# Patient Record
Sex: Male | Born: 1965 | Race: White | Hispanic: No | Marital: Married | Smoking: Former smoker
Health system: Southern US, Community
[De-identification: ages and names within clinical notes are randomized; demographics above are authoritative.]

## PROBLEM LIST (undated history)

## (undated) HISTORY — PX: APPENDECTOMY: SHX54

## (undated) HISTORY — PX: HIP SURGERY: SHX245

## (undated) HISTORY — PX: HERNIA REPAIR: SHX51

---

## 2002-08-06 ENCOUNTER — Emergency Department (HOSPITAL_COMMUNITY): Admission: EM | Admit: 2002-08-06 | Discharge: 2002-08-07 | Payer: Self-pay | Admitting: Emergency Medicine

## 2002-08-15 ENCOUNTER — Emergency Department (HOSPITAL_COMMUNITY): Admission: EM | Admit: 2002-08-15 | Discharge: 2002-08-15 | Payer: Self-pay | Admitting: Emergency Medicine

## 2002-11-07 ENCOUNTER — Encounter: Admission: RE | Admit: 2002-11-07 | Discharge: 2003-02-05 | Payer: Self-pay | Admitting: Orthopaedic Surgery

## 2012-12-03 ENCOUNTER — Emergency Department (HOSPITAL_COMMUNITY): Payer: BC Managed Care – PPO

## 2012-12-03 ENCOUNTER — Emergency Department (HOSPITAL_COMMUNITY)
Admission: EM | Admit: 2012-12-03 | Discharge: 2012-12-04 | Disposition: A | Discharge status: Home / Self Care | Payer: BC Managed Care – PPO | Attending: Emergency Medicine | Admitting: Emergency Medicine

## 2012-12-03 ENCOUNTER — Encounter (HOSPITAL_COMMUNITY): Payer: Self-pay | Admitting: *Deleted

## 2012-12-03 DIAGNOSIS — N453 Epididymo-orchitis: Secondary | ICD-10-CM | POA: Insufficient documentation

## 2012-12-03 DIAGNOSIS — Z87891 Personal history of nicotine dependence: Secondary | ICD-10-CM | POA: Insufficient documentation

## 2012-12-03 DIAGNOSIS — N451 Epididymitis: Secondary | ICD-10-CM

## 2012-12-03 DIAGNOSIS — N5089 Other specified disorders of the male genital organs: Secondary | ICD-10-CM | POA: Insufficient documentation

## 2012-12-03 NOTE — ED Notes (Signed)
Pt states that he began to have lower abd pain and testicular pain; pt states that he was seen at Urgent Care and was told no UTI and needed to come to the ER for evaluation; pt states that there is swelling and tenderness to left testicle

## 2012-12-03 NOTE — ED Notes (Signed)
[id number]<AV> Expected date:<BR> Expected time:<BR> Means of arrival:<BR> Comments:<BR>

## 2012-12-04 MED ORDER — IBUPROFEN 600 MG PO TABS: 600.0000 mg | ORAL_TABLET | Freq: Four times a day (QID) | ORAL | Status: DC | PRN

## 2012-12-04 MED ORDER — CIPROFLOXACIN HCL 500 MG PO TABS
500.0000 mg | ORAL_TABLET | Freq: Once | ORAL | Status: AC
  Administered 2012-12-04: 500 mg via ORAL
  Filled 2012-12-04 (×2): qty 1

## 2012-12-04 MED ORDER — HYDROCODONE-ACETAMINOPHEN 5-325 MG PO TABS: 1.0000 | ORAL_TABLET | Freq: Four times a day (QID) | ORAL | Status: DC | PRN

## 2012-12-04 MED ORDER — CIPROFLOXACIN HCL 500 MG PO TABS: 500.0000 mg | ORAL_TABLET | Freq: Two times a day (BID) | ORAL | Status: DC

## 2012-12-04 MED ORDER — CEFTRIAXONE SODIUM 250 MG IJ SOLR
250.0000 mg | Freq: Once | INTRAMUSCULAR | Status: AC
  Administered 2012-12-04: 250 mg via INTRAMUSCULAR
  Filled 2012-12-04: qty 250

## 2012-12-04 NOTE — ED Provider Notes (Signed)
History     CSN: [id number]  Arrival date & time 12/03/12  2019   First MD Initiated Contact with Patient 12/03/12 2358      Chief Complaint  Patient presents with  . Testicle Pain    (Consider location/radiation/quality/duration/timing/severity/associated sxs/prior treatment) HPI Tyler Garrison is a 47 y.o. male who presents to ED with complaint of testicular pain. Stats pain began during lunch time today. Pain in the left side only, states noted swelling. Went to UC. Had UA done there which was normal. Was sent here. Pt denies taking any medications for it. Denies fever, chills. Urinary symptoms. No testicular trauma. No hx of the same.    History reviewed. No pertinent past medical history.  Past Surgical History  Procedure Laterality Date  . Hip surgery    . Hernia repair      No family history on file.  History  Substance Use Topics  . Smoking status: Former Games developer  . Smokeless tobacco: Not on file  . Alcohol Use: Yes     Comment: with dinner      Review of Systems  Constitutional: Negative for fever and chills.  Respiratory: Negative.   Cardiovascular: Negative.   Genitourinary: Positive for scrotal swelling and testicular pain. Negative for dysuria, urgency, frequency, flank pain, discharge and penile pain.  Skin: Negative.   All other systems reviewed and are negative.    Allergies  Review of patient's allergies indicates no known allergies.  Home Medications  No current outpatient prescriptions on file.  BP 138/88  Pulse 83  Temp(Src) 98.3 F (36.8 C) (Oral)  Resp 20  Wt 202 lb 4 oz (91.74 kg)  SpO2 97%  Physical Exam  Nursing note and vitals reviewed. Constitutional: He is oriented to person, place, and time. He appears well-developed and well-nourished. No distress.  Eyes: Conjunctivae are normal.  Neck: Neck supple.  Cardiovascular: Normal rate, regular rhythm and normal heart sounds.   Pulmonary/Chest: Effort normal and breath sounds  normal. No respiratory distress. He has no wheezes. He has no rales.  Genitourinary: Penis normal.  Left testicular swelling. Tender to palpation. No hernia  Neurological: He is alert and oriented to person, place, and time.  Skin: Skin is warm and dry.    ED Course  Procedures (including critical care time)  Labs Reviewed - No data to display US Scrotum  12/03/2012  *RADIOLOGY REPORT*  Clinical Data:  Left testicle pain.  SCROTAL ULTRASOUND DOPPLER ULTRASOUND OF THE TESTICLES  Technique: Complete ultrasound examination of the testicles, epididymis, and other scrotal structures was performed.  Color and spectral Doppler ultrasound were also utilized to evaluate blood flow to the testicles.  Comparison:  Findings:  Right testis:  Normal.  4.3 x 2.2 x 3.0 cm.  Left testis:  Normal.  4.2 x 2.4 x 2.8 cm.  Right epididymis:  3 mm epididymal cyst.  Otherwise normal.  Left epididymis:  The left epididymis is enlarged with hypervascularity consistent with epididymitis.  Hydrocele:  Tiny left hydrocele.  Varicocele:  Prominent vascularity in the region of the left inguinal canal suggesting a varicocele.  Pulsed Doppler interrogation of both testes demonstrates low resistance flow bilaterally.  IMPRESSION:  1.  No evidence of testicular torsion. 2.  Left epididymitis. 3.  Probable left varicocele.   Original Report Authenticated By: Francene Boyers, M.D.    [place] Art/ven Flow Abd Pelv Doppler Limited  12/03/2012  *RADIOLOGY REPORT*  Clinical Data:  Left testicle pain.  SCROTAL ULTRASOUND DOPPLER ULTRASOUND OF  THE TESTICLES  Technique: Complete ultrasound examination of the testicles, epididymis, and other scrotal structures was performed.  Color and spectral Doppler ultrasound were also utilized to evaluate blood flow to the testicles.  Comparison:  Findings:  Right testis:  Normal.  4.3 x 2.2 x 3.0 cm.  Left testis:  Normal.  4.2 x 2.4 x 2.8 cm.  Right epididymis:  3 mm epididymal cyst.  Otherwise normal.  Left  epididymis:  The left epididymis is enlarged with hypervascularity consistent with epididymitis.  Hydrocele:  Tiny left hydrocele.  Varicocele:  Prominent vascularity in the region of the left inguinal canal suggesting a varicocele.  Pulsed Doppler interrogation of both testes demonstrates low resistance flow bilaterally.  IMPRESSION:  1.  No evidence of testicular torsion. 2.  Left epididymitis. 3.  Probable left varicocele.   Original Report Authenticated By: Francene Boyers, M.D.      1. Epididymitis, left       MDM  Pt's UA completely normal at [facility], he has results with him. His US shows left epididymitis. Treated in ED with rocephin 250mg  IM. cipro 500mg  PO. Will start on cipro 500mg  bid for 10 days. Follow up with urology. Pt  Otherwise non toxic, no distress. Vs normal.   Filed Vitals:   12/03/12 2032  BP: 138/88  Pulse: 83  Temp: 98.3 F (36.8 C)  TempSrc: Oral  Resp: 20  Weight: 202 lb 4 oz (91.74 kg)  SpO2: 97%           Tyler Gram [name], PA-C 12/04/12 0023

## 2012-12-04 NOTE — ED Provider Notes (Signed)
Medical screening examination/treatment/procedure(s) were performed by non-physician practitioner and as supervising physician I was immediately available for consultation/collaboration.    Celene Kras, MD 12/04/12 0630

## 2013-10-12 ENCOUNTER — Ambulatory Visit (INDEPENDENT_AMBULATORY_CARE_PROVIDER_SITE_OTHER): Payer: BC Managed Care – PPO | Admitting: Emergency Medicine

## 2013-10-12 VITALS — BP 120/88 | HR 73 | Temp 98.1°F | Resp 16 | Ht 70.5 in | Wt 202.0 lb

## 2013-10-12 DIAGNOSIS — T1502XA Foreign body in cornea, left eye, initial encounter: Secondary | ICD-10-CM

## 2013-10-12 DIAGNOSIS — T1500XA Foreign body in cornea, unspecified eye, initial encounter: Secondary | ICD-10-CM

## 2013-10-12 DIAGNOSIS — M546 Pain in thoracic spine: Secondary | ICD-10-CM

## 2013-10-12 MED ORDER — HYDROCODONE-ACETAMINOPHEN 5-325 MG PO TABS: 1.0000 | ORAL_TABLET | Freq: Four times a day (QID) | ORAL | Status: DC | PRN

## 2013-10-12 MED ORDER — OFLOXACIN 0.3 % OP SOLN: 1.0000 [drp] | Freq: Four times a day (QID) | OPHTHALMIC | Status: DC

## 2013-10-12 NOTE — Patient Instructions (Signed)
Corneal Abrasion  The cornea is the clear covering at the front and center of the eye. When looking at the colored portion of the eye (iris), you are looking through the cornea. This very thin tissue is made up of many layers. The surface layer is a single layer of cells (corneal epithelium) and is one of the most sensitive tissues in the body. If a scratch or injury causes the corneal epithelium to come off, it is called a corneal abrasion. If the injury extends to the tissues below the epithelium, the condition is called a corneal ulcer.  CAUSES    Scratches.   Trauma.   Foreign body in the eye.  Some people have recurrences of abrasions in the area of the original injury even after it has healed (recurrent erosion syndrome). Recurrent erosion syndrome generally improves and goes away with time.  SYMPTOMS    Eye pain.   Difficulty or inability to keep the injured eye open.   The eye becomes very sensitive to light.   Recurrent erosions tend to happen suddenly, first thing in the morning, usually after waking up and opening the eye.  DIAGNOSIS   Your health care provider can diagnose a corneal abrasion during an eye exam. Dye is usually placed in the eye using a drop or a small paper strip moistened by your tears. When the eye is examined with a special light, the abrasion shows up clearly because of the dye.  TREATMENT    Small abrasions may be treated with antibiotic drops or ointment alone.   Usually a pressure patch is specially applied. Pressure patches prevent the eye from blinking, allowing the corneal epithelium to heal. A pressure patch also reduces the amount of pain present in the eye during healing. Most corneal abrasions heal within 2 3 days with no effect on vision.  If the abrasion becomes infected and spreads to the deeper tissues of the cornea, a corneal ulcer can result. This is serious because it can cause corneal scarring. Corneal scars interfere with light passing through the cornea  and cause a loss of vision in the involved eye.  HOME CARE INSTRUCTIONS   Use medicine or ointment as directed. Only take over-the-counter or prescription medicines for pain, discomfort, or fever as directed by your health care provider.   Do not drive or operate machinery while your eye is patched. Your ability to judge distances is impaired.   If your health care provider has given you a follow-up appointment, it is very important to keep that appointment. Not keeping the appointment could result in a severe eye infection or permanent loss of vision. If there is any problem keeping the appointment, let your health care provider know.  SEEK MEDICAL CARE IF:    You have pain, light sensitivity, and a scratchy feeling in one eye or both eyes.   Your pressure patch keeps loosening up, and you can blink your eye under the patch after treatment.   Any kind of discharge develops from the eye after treatment or if the lids stick together in the morning.   You have the same symptoms in the morning as you did with the original abrasion days, weeks, or months after the abrasion healed.  MAKE SURE YOU:    Understand these instructions.   Will watch your condition.   Will get help right away if you are not doing well or get worse.  Document Released: 08/05/2000 Document Revised: 05/29/2013 Document Reviewed: 04/15/2013  [employer] Patient Information   2014 [employer]

## 2013-10-12 NOTE — Progress Notes (Addendum)
Subjective:   Patient ID: Tyler MoatBrian K Furnas, male    DOB: 11/08/1965, 48 y.o.   MRN: [id number]  HPI  This chart was scribed for Tyler SpareSteven A. Cleta Albertsaub, MD, by Tyler Garrison, ED Scribe. This patient was seen in room 11 and the patient's care was started at 4:31 PM.  HPI Comments: Tyler Garrison is a 48 y.o. male who [facility] complaining of L eye pain with onset one day.Patient describes the pain as a constant, non changing irritation. He reports that upon examining his eye in the mirror, he has noticed some mild visual disturbances in the left eye, which he describes as blurry vision. Patient denies any FB entering his eyes. He states that he was spraying a water-based, clear lacquer on wood yesterday and is concerned he may have gotten some in his eye. Patient confirms having a pair of protective eye glasses that were slightly undersized. 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 He denies trying to wash his eyes with any products.  Patient also reports of constant R upper back pain with onset one day. Patient reports he was working and while moving objects he "twisted the wrong way." Patient describes the pain as a soreness that is constant and non changing. He denies any other pain or neck stiffness.  History reviewed. No pertinent past medical history.  Past Surgical History  Procedure Laterality Date  . Hip surgery    . Hernia repair      History reviewed. No pertinent family history.  History   Social History  . Marital Status: Married    Spouse Name: N/A    Number of Children: N/A  . Years of Education: N/A   Occupational History  . Not on file.   Social History Main Topics  . Smoking status: Former Games developermoker  . Smokeless tobacco: Not on file  . Alcohol Use: Yes     Comment: with dinner  . Drug Use: No  . Sexual Activity: Not on file   Other Topics Concern  . Not on file   Social History Narrative  . No narrative on file    No Known  Allergies  There are no active problems to display for this patient.   No results found for this or any previous visit.  Current Outpatient Prescriptions on File Prior to Visit  Medication Sig Dispense Refill  . ciprofloxacin (CIPRO) 500 MG tablet Take 1 tablet (500 mg total) by mouth 2 (two) times daily.  20 tablet  0  . HYDROcodone-acetaminophen (NORCO) 5-325 MG per tablet Take 1 tablet by mouth every 6 (six) hours as needed for pain.  20 tablet  0  . ibuprofen (ADVIL,MOTRIN) 600 MG tablet Take 1 tablet (600 mg total) by mouth every 6 (six) hours as needed for pain.  30 tablet  0   No current facility-administered medications on file prior to visit.    BP 120/88  Pulse 73  Temp(Src) 98.1 F (36.7 C) (Oral)  Resp 16  Ht 5' 10.5" (1.791 m)  Wt 202 lb (91.627 kg)  BMI 28.56 kg/m2  SpO2 99%   Review of Systems  Constitutional: Negative for fever and chills.  Eyes: Positive for pain and visual disturbance.  Respiratory: Negative for shortness of breath.   Gastrointestinal: Negative for nausea and vomiting.  Musculoskeletal: Positive for back pain.  Neurological: Negative for weakness.   A complete 10 system review of systems was obtained and all systems are negative except  as noted in the HPI and PMH.   Objective:   Physical Exam  Nursing note and vitals reviewed. CONSTITUTIONAL: Well developed/well nourished HEAD: Normocephalic/atraumatic EYES: There is a 1 mm area of uptake at 2:00 left adjacent to the corneal scleral junction. This was seen after 3 drops of Pontocaine were instilled along with using porcine stain. No foreign body was seen beneath the lid. A moist Q-tip was used and this area appeared to be removed as the uptake appeared much less prominent post use of a Q-tip for foreign body removal. Patient tolerated well ENMT: Mucous membranes moist NECK: supple no meningeal signs SPINE:entire spine nontender CV: S1/S2 noted, no murmurs/rubs/gallops noted LUNGS:  Lungs are clear to auscultation bilaterally, no apparent distress ABDOMEN: soft, nontender, no rebound or guarding GU:no cva tenderness NEURO: Pt is awake/alert, moves all extremitiesx4 EXTREMITIES: pulses normal, full ROM SKIN: warm, color normal PSYCH: no abnormalities of mood noted There is tenderness adjacent to the right scapula and just beneath the right scapula . Deep tendon reflexes of the upper extremity to 2+ and symmetrical Assessment & Plan:     he was given a prescription for Ocuflox 1 drop in the left eye every 4-6 hours. He will return to clinic in the morning for an eye check he was given #10 hydrocodone to take for severe back pain. I felt like I removed the foreign body with a Q-tip but I wanted to recheck that area in the morning to be sure I personally performed the services described in this documentation, which was scribed in my presence. The recorded information has been reviewed and is accurate. **Disclaimer: This note was dictated with voice recognition software. Similar sounding words can inadvertently be transcribed and this note may contain transcription errors which may not have been corrected upon publication of note.**

## 2013-10-13 ENCOUNTER — Ambulatory Visit (INDEPENDENT_AMBULATORY_CARE_PROVIDER_SITE_OTHER): Payer: BC Managed Care – PPO | Admitting: Emergency Medicine

## 2013-10-13 VITALS — BP 116/80 | HR 86 | Temp 98.3°F | Resp 12 | Ht 71.0 in | Wt 201.6 lb

## 2013-10-13 DIAGNOSIS — S0500XA Injury of conjunctiva and corneal abrasion without foreign body, unspecified eye, initial encounter: Secondary | ICD-10-CM

## 2013-10-13 DIAGNOSIS — S058X9A Other injuries of unspecified eye and orbit, initial encounter: Secondary | ICD-10-CM

## 2013-10-13 DIAGNOSIS — M25511 Pain in right shoulder: Secondary | ICD-10-CM

## 2013-10-13 DIAGNOSIS — M25519 Pain in unspecified shoulder: Secondary | ICD-10-CM

## 2013-10-13 NOTE — Progress Notes (Signed)
   Subjective:    Patient ID: Tyler Garrison, male    DOB: 01/03/1966, 48 y.o.   MRN: [id number]  HPI patient here for followup of his left corneal abrasion. A small foreign body was removed with acute hip he is here today for followup and recheck on an area of fluorescein uptake in the left.eye. He states he feels fine. He no longer has any pain or discomfort or foreign body sensation. The pain in the right scapular area is also better.    Review of Systems     Objective:   Physical Exam Pupils are equal and reactive to light disc margins sharp cornea appears clear 3 drops of Pontocaine were instilled along with forcing staining. There was no uptake in the area of abnormality seen yesterday.       Assessment & Plan:  This corneal abrasion has resolved. He is to finish his antibiotics today and after that he can stop

## 2013-10-16 ENCOUNTER — Telehealth: Payer: Self-pay

## 2013-10-16 NOTE — Telephone Encounter (Signed)
Pt requesting refill for hydrocodone   Best phone [phone]

## 2013-10-17 MED ORDER — HYDROCODONE-ACETAMINOPHEN 5-325 MG PO TABS: 1.0000 | ORAL_TABLET | Freq: Four times a day (QID) | ORAL | Status: DC | PRN

## 2013-10-17 NOTE — Telephone Encounter (Signed)
Notified pt ready and needs to RTC if pain persists. Pt agreed.

## 2013-10-17 NOTE — Telephone Encounter (Signed)
Okay to refill  #12 tablets. If patient is continuing to have back pain he needs reevaluation.

## 2015-07-02 IMAGING — CR DG ELBOW COMPLETE 3+V*R*
3 series · 3 of 3 positions shown · non-contrast
Comparison: None.

CLINICAL DATA: Right elbow pain after injury.

EXAM:
RIGHT ELBOW - COMPLETE 3+ VIEW

[AP]
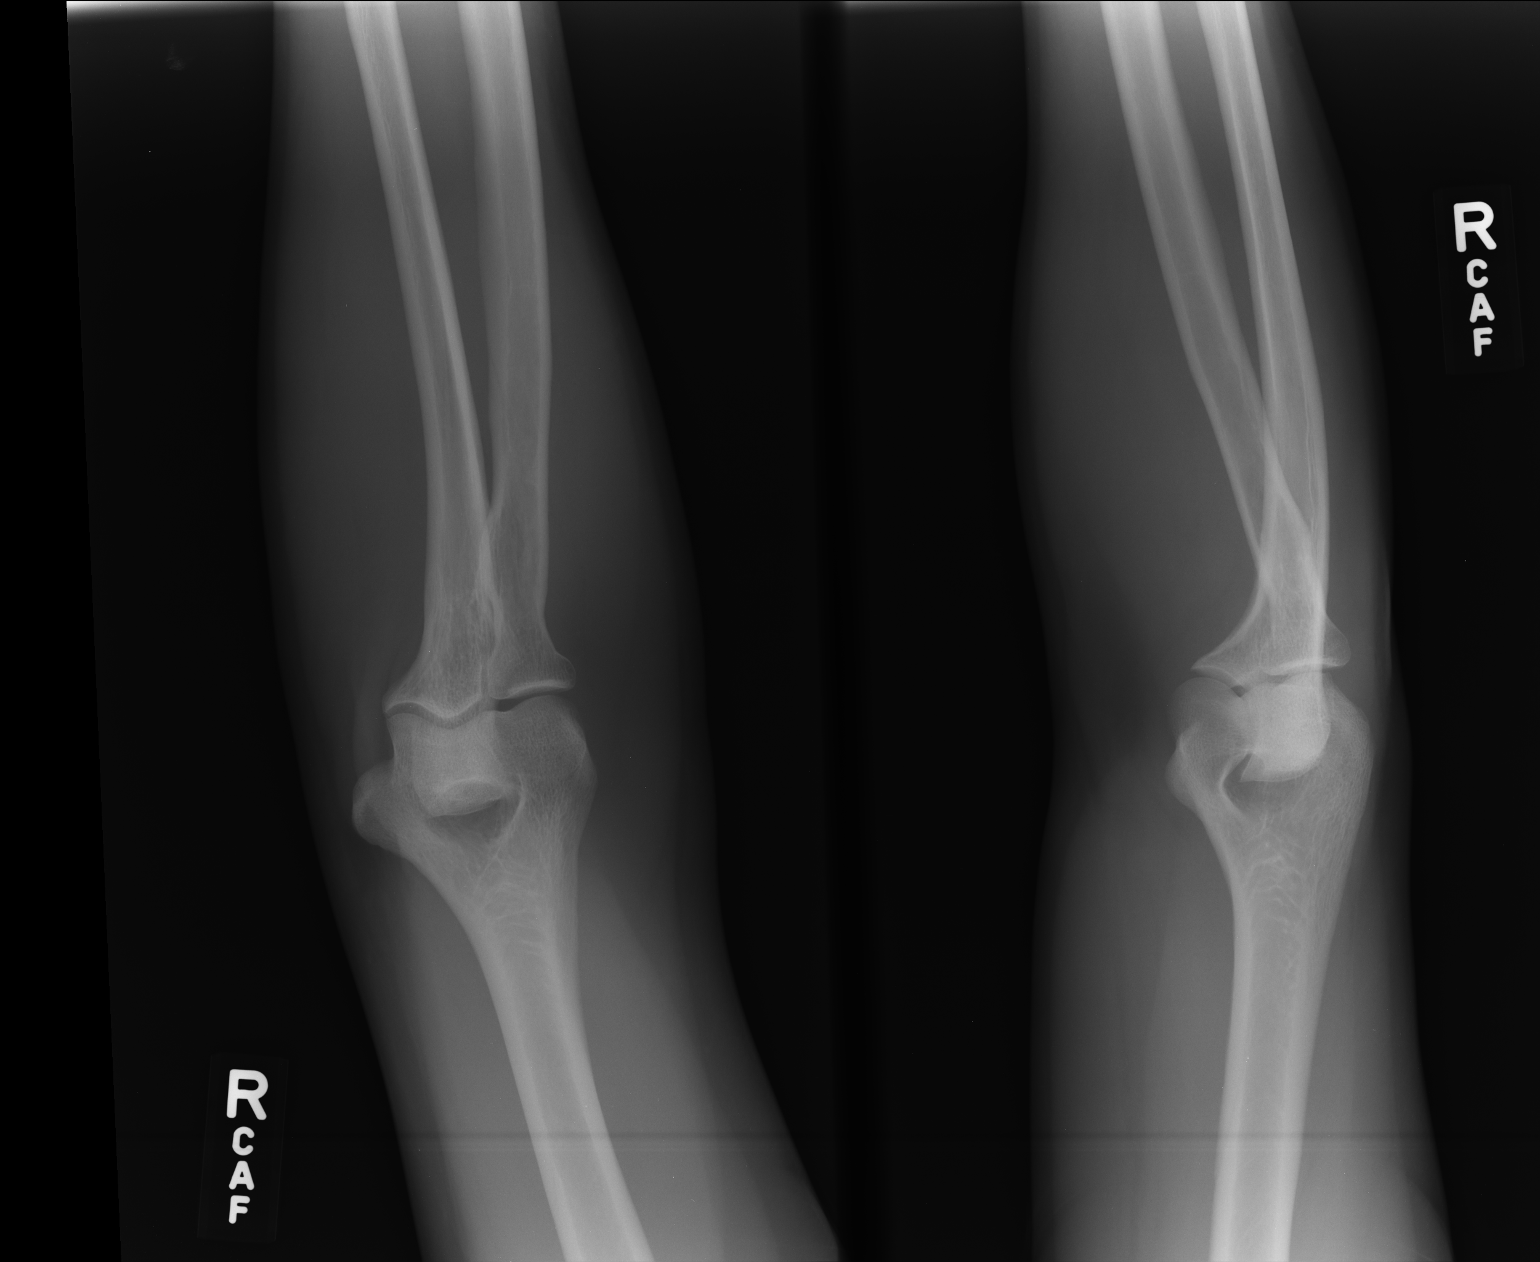

[lateral]
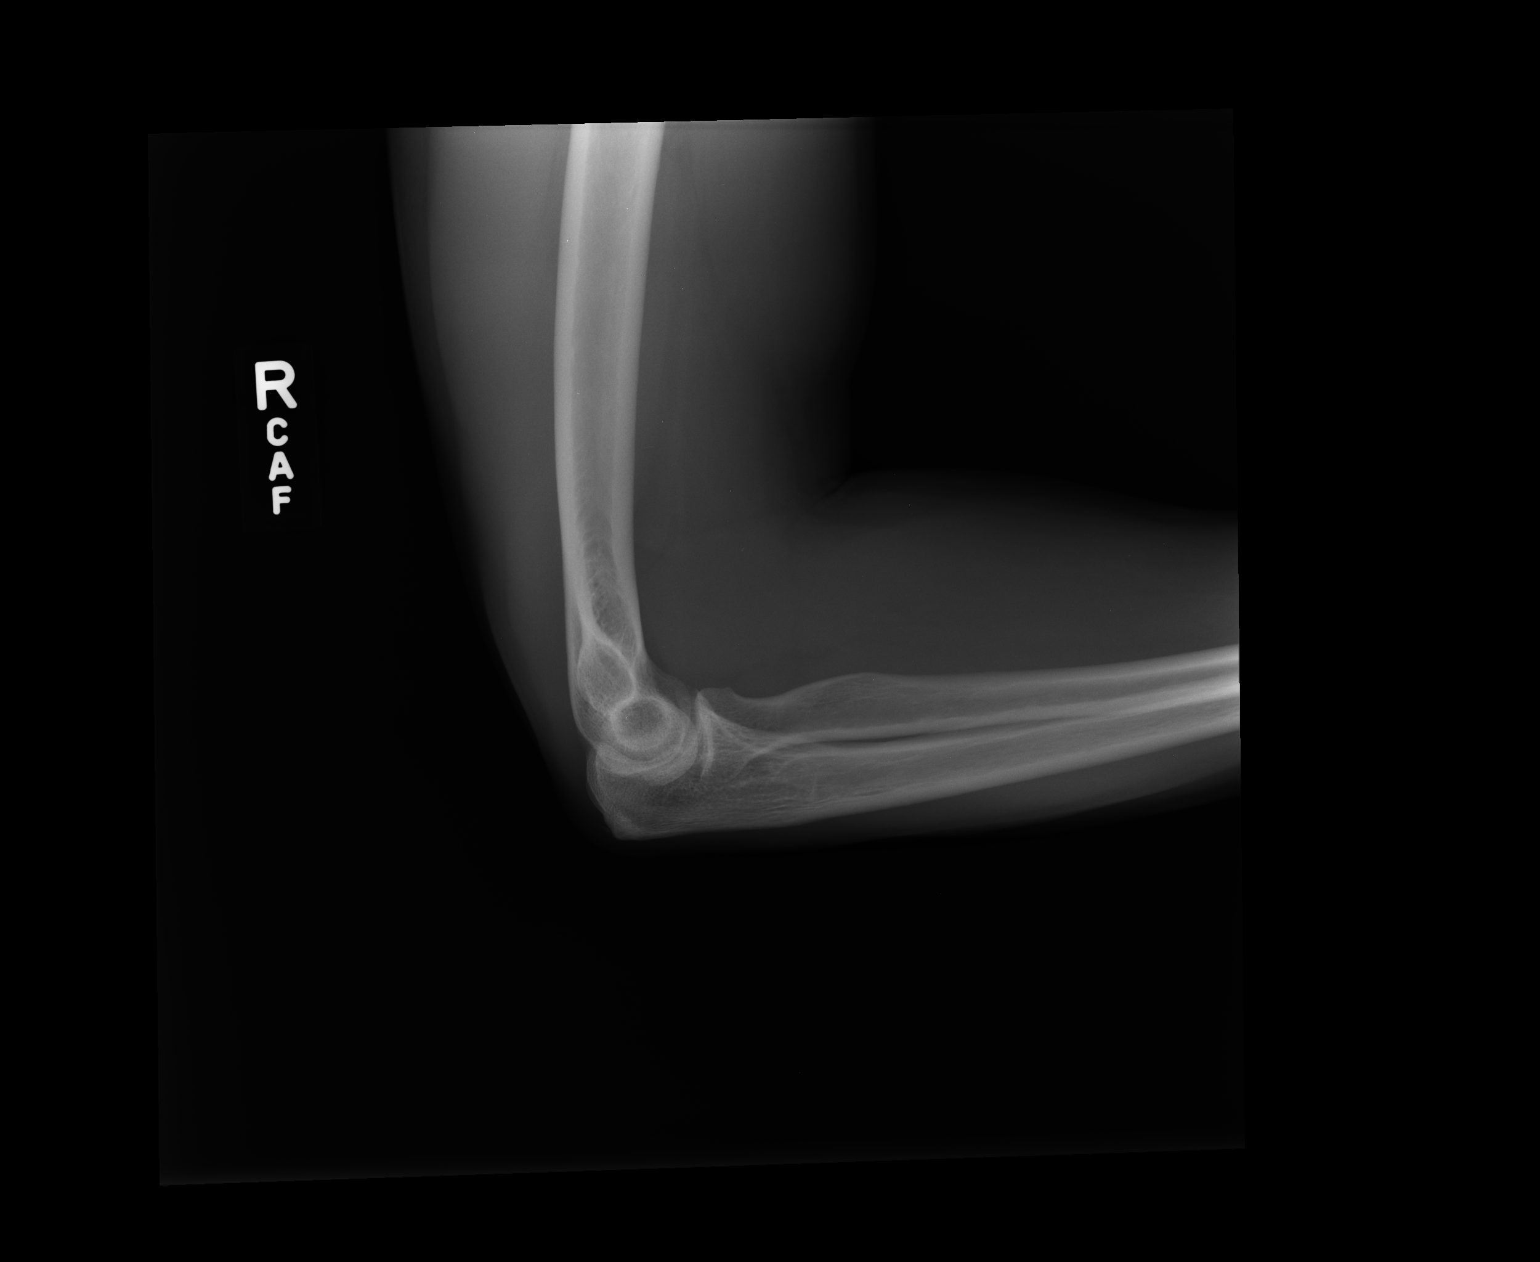

[ap obl ext rot]
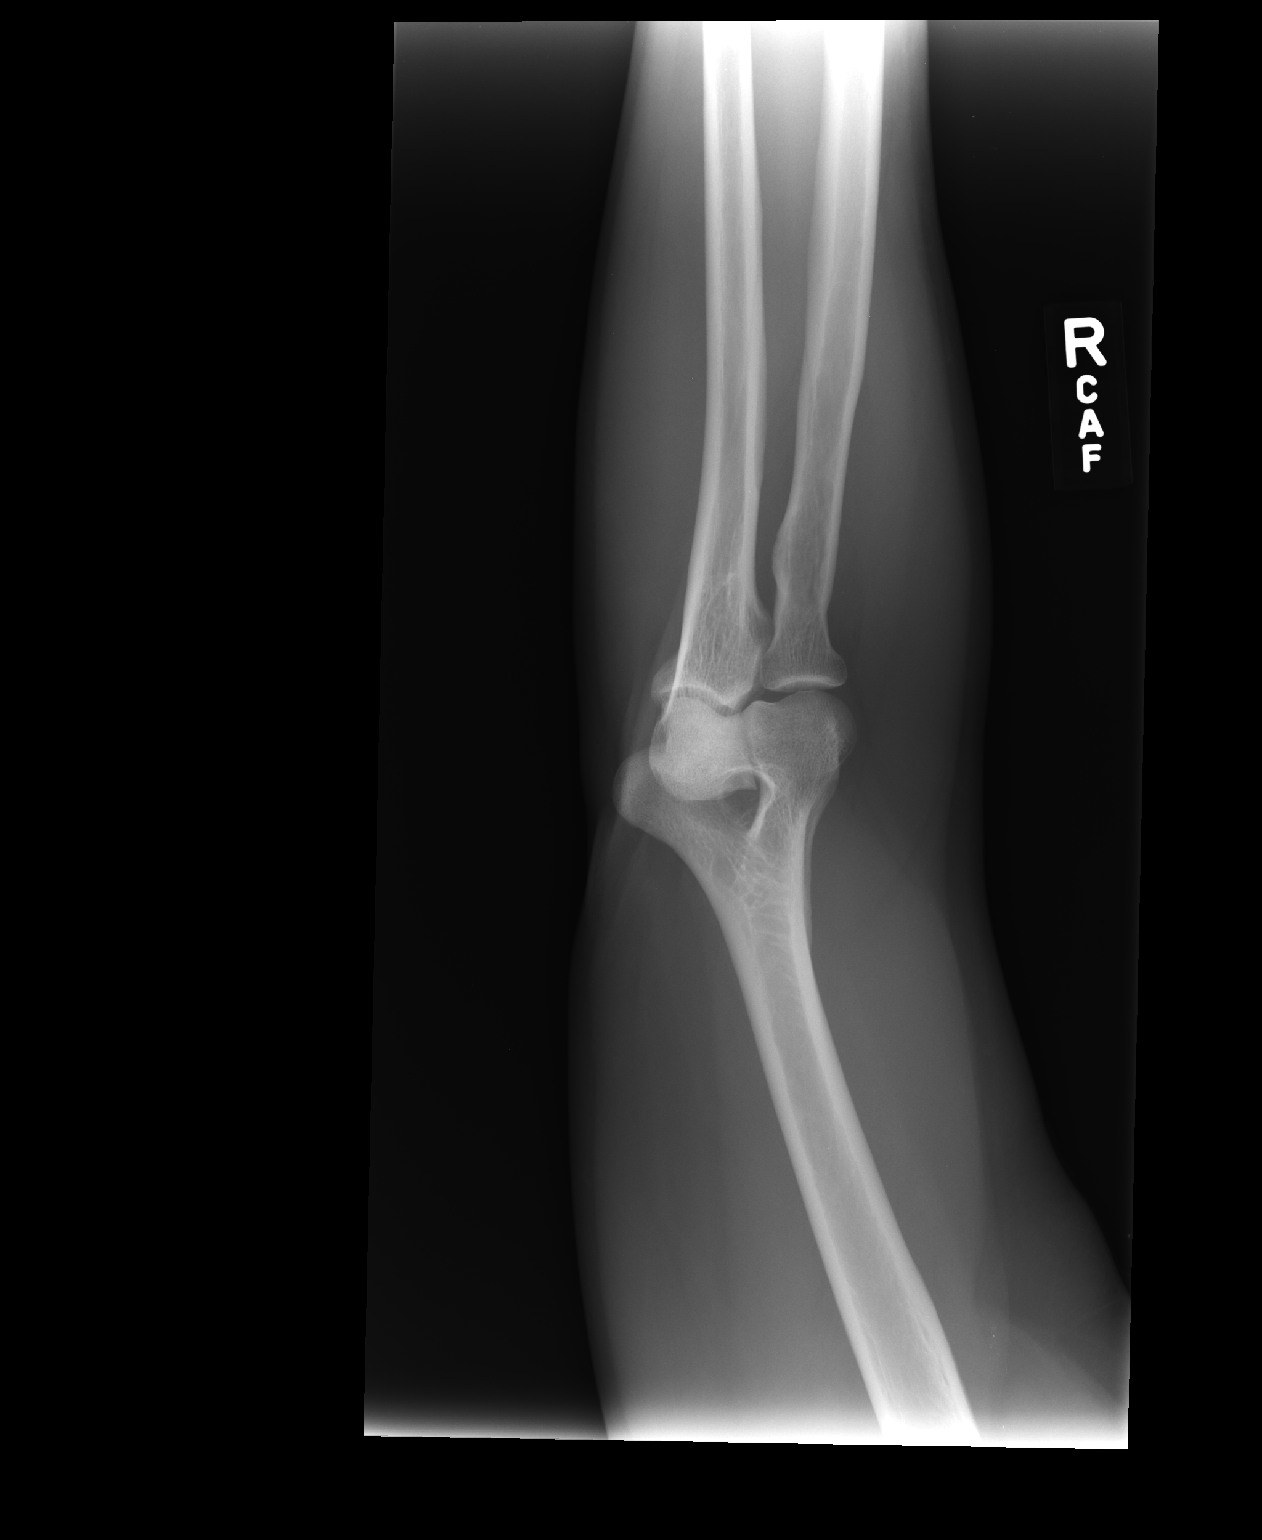

[3 of 3 positions shown; findings below may reference images not displayed]

FINDINGS: There is no evidence of fracture, dislocation, or joint effusion.
There is no evidence of arthropathy or other focal bone abnormality.
Soft tissues are unremarkable.
IMPRESSION: Negative.

## 2016-11-02 ENCOUNTER — Emergency Department (HOSPITAL_COMMUNITY): Payer: Self-pay

## 2016-11-02 ENCOUNTER — Emergency Department (HOSPITAL_COMMUNITY)
Admission: EM | Admit: 2016-11-02 | Discharge: 2016-11-02 | Disposition: A | Discharge status: Home / Self Care | Payer: Self-pay | Attending: Emergency Medicine | Admitting: Emergency Medicine

## 2016-11-02 ENCOUNTER — Encounter (HOSPITAL_COMMUNITY): Payer: Self-pay | Admitting: Emergency Medicine

## 2016-11-02 DIAGNOSIS — Y999 Unspecified external cause status: Secondary | ICD-10-CM | POA: Insufficient documentation

## 2016-11-02 DIAGNOSIS — S7002XA Contusion of left hip, initial encounter: Secondary | ICD-10-CM | POA: Insufficient documentation

## 2016-11-02 DIAGNOSIS — Z87891 Personal history of nicotine dependence: Secondary | ICD-10-CM | POA: Insufficient documentation

## 2016-11-02 DIAGNOSIS — M545 Low back pain, unspecified: Secondary | ICD-10-CM

## 2016-11-02 DIAGNOSIS — Z79899 Other long term (current) drug therapy: Secondary | ICD-10-CM | POA: Insufficient documentation

## 2016-11-02 DIAGNOSIS — Y929 Unspecified place or not applicable: Secondary | ICD-10-CM | POA: Insufficient documentation

## 2016-11-02 DIAGNOSIS — W1839XA Other fall on same level, initial encounter: Secondary | ICD-10-CM | POA: Insufficient documentation

## 2016-11-02 DIAGNOSIS — Y939 Activity, unspecified: Secondary | ICD-10-CM | POA: Insufficient documentation

## 2016-11-02 MED ORDER — IBUPROFEN 800 MG PO TABS: 800.0000 mg | ORAL_TABLET | Freq: Four times a day (QID) | ORAL | 0 refills | Status: AC | PRN

## 2016-11-02 MED ORDER — BUPIVACAINE HCL (PF) 0.5 % IJ SOLN
20.0000 mL | Freq: Once | INTRAMUSCULAR | Status: AC
  Administered 2016-11-02: 20 mL
  Filled 2016-11-02: qty 30

## 2016-11-02 MED ORDER — IBUPROFEN 800 MG PO TABS
800.0000 mg | ORAL_TABLET | Freq: Once | ORAL | Status: AC
  Administered 2016-11-02: 800 mg via ORAL
  Filled 2016-11-02: qty 1

## 2016-11-02 MED ORDER — ACETAMINOPHEN 500 MG PO TABS: 1000.0000 mg | ORAL_TABLET | Freq: Four times a day (QID) | ORAL | 0 refills | Status: AC | PRN

## 2016-11-02 NOTE — ED Triage Notes (Signed)
Pt comes with left hip pain after being dragged down by his dogs today while walking them. Pt reports having surgery on that hip and having artificial parts and wants to make sure that none came loose.

## 2016-11-02 NOTE — ED Provider Notes (Signed)
[facility]-EMERGENCY DEPT Provider Note   CSN: [id number] Arrival date & time: 11/02/16  2015     History   Chief Complaint Chief Complaint  Patient presents with  . Hip Pain    HPI Tyler Garrison is a 51 y.o. male.  The history is provided by the patient.  Hip Pain  This is a new problem. The current episode started 3 to 5 hours ago. The problem occurs constantly. The problem has not changed since onset.Pertinent negatives include no chest pain, no abdominal pain and no shortness of breath. The symptoms are aggravated by bending and twisting. Nothing relieves the symptoms. He has tried nothing for the symptoms.    History reviewed. No pertinent past medical history.  There are no active problems to display for this patient.   Past Surgical History:  Procedure Laterality Date  . HERNIA REPAIR    . HIP SURGERY         Home Medications    Prior to Admission medications   Medication Sig Start Date End Date Taking? Authorizing Provider  HYDROcodone-acetaminophen (NORCO) 5-325 MG per tablet Take 1 tablet by mouth every 6 (six) hours as needed. 10/17/13   Collene Gobble, MD  ofloxacin (OCUFLOX) 0.3 % ophthalmic solution Place 1 drop into the left eye 4 (four) times daily. 10/12/13   Collene Gobble, MD    Family History Family History  Problem Relation Age of Onset  . Adopted: Yes    Social History Social History  Substance Use Topics  . Smoking status: Former Smoker    Years: 2.00  . Smokeless tobacco: Former Neurosurgeon    Types: Snuff    Quit date: 10/13/2010  . Alcohol use Yes     Comment: with dinner     Allergies   Patient has no known allergies.   Review of Systems Review of Systems  Respiratory: Negative for shortness of breath.   Cardiovascular: Negative for chest pain.  Gastrointestinal: Negative for abdominal pain.  All other systems reviewed and are negative.    Physical Exam Updated Vital Signs BP (!) 147/109 (BP Location: Left Arm)   Pulse 90    Temp 98.6 F (37 C) (Oral)   Resp 16   Ht 5\' 11"  (1.803 m)   Wt 190 lb (86.2 kg)   SpO2 100%   BMI 26.50 kg/m   Physical Exam  Constitutional: He is oriented to person, place, and time. He appears well-developed and well-nourished. No distress.  HENT:  Head: Normocephalic and atraumatic.  Nose: Nose normal.  Eyes: Conjunctivae are normal.  Neck: Neck supple. No tracheal deviation present.  Cardiovascular: Normal rate and regular rhythm.   Pulmonary/Chest: Effort normal. No respiratory distress.  Abdominal: Soft. He exhibits no distension.  Musculoskeletal:  Tenderness over lateral left hip and left lower back  Neurological: He is alert and oriented to person, place, and time.  Skin: Skin is warm and dry.  Psychiatric: He has a normal mood and affect.     ED Treatments / Results  Labs (all labs ordered are listed, but only abnormal results are displayed) Labs Reviewed - No data to display  EKG  EKG Interpretation None       Radiology Dg Hip Unilat With Pelvis 2-3 Views Left  Result Date: 11/02/2016 CLINICAL DATA:  51 year old male with left hip injury. History of left femoral fixation hardware. EXAM: DG HIP (WITH OR WITHOUT PELVIS) 2-3V LEFT COMPARISON:  Abdominal radiograph dated 05/13/2011 FINDINGS: Left femoral orthopedic hardware including  fixation plate and screws of the proximal femoral shaft and transcervical screw. The orthopedic hardware appears intact. 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 Correlation with clinical exam and direct comparison with prior images, if available, recommended. There is no evidence of hardware loosening. No definite acute fracture identified. Mild degenerative changes of the left hip. The soft tissues appear unremarkable. IMPRESSION: 1. 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  Correlation with clinical exam and direct comparison with prior images if available recommended. No definite acute fracture identified. 2. Left femoral orthopedic hardware appears intact and in anatomic alignment. Electronically Signed   By: Elgie CollardArash  Radparvar M.D.   On: 11/02/2016 21:06    Procedures Procedures (including critical care time)  Procedure Note: Trigger Point Injection for Myofascial pain  Performed by Dr. Clydene PughKnott Indication: muscle/myofascial pain Muscle body and tendon sheath of the left lateral hip and left lumbar paraspinal muscle(s) were injected with 0.5% bupivacaine under sterile technique for release of muscle spasm/pain. Patient tolerated well with immediate improvement of symptoms and no immediate complications following procedure.  CPT Code:   1 or 2 muscle bodies: 20552   Medications Ordered in ED Medications  bupivacaine (MARCAINE) 0.5 % injection 20 mL (20 mLs Infiltration Given by Other 11/02/16 2142)  ibuprofen (ADVIL,MOTRIN) tablet 800 mg (800 mg Oral Given 11/02/16 2128)     Initial Impression / Assessment and Plan / ED Course  I have reviewed the triage vital signs and the nursing notes.  Pertinent labs & imaging results that were available during my care of the patient were reviewed by me and considered in my medical decision making (see chart for details).     51 y.o. male presents with left hip and left low back pain after falling earlier today. He has been ambulatory and continues to be ambulatory in the room here. After sitting for a period he became stiff and pain started.   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 Hardware is in place. Pt requesting percocet but I explained that this was not likely to help as he mostly needed early mobility and NSAIDs/tylenol. Provided lcal anaesthesia over problem areas and plan for rehabilitation and OP discussion with [facility]opedic surgeons. Walking with steady gait on discharge.   Final Clinical Impressions(s) / ED Diagnoses   Final diagnoses:  Contusion of left hip, initial encounter  Acute left-sided low back pain without sciatica    New Prescriptions Discharge Medication List as of 11/02/2016  9:43 PM    START taking these medications   Details  ibuprofen (ADVIL,MOTRIN) 800 MG tablet Take 1 tablet (800 mg total) by mouth every 6 (six) hours as needed for moderate pain., Starting Wed 11/02/2016, Print         Lyndal Pulleyaniel Cailen Mihalik, MD 11/03/16 [phone]

## 2016-11-03 ENCOUNTER — Encounter (HOSPITAL_COMMUNITY): Payer: Self-pay | Admitting: Emergency Medicine

## 2016-11-03 ENCOUNTER — Emergency Department (HOSPITAL_COMMUNITY)
Admission: EM | Admit: 2016-11-03 | Discharge: 2016-11-03 | Disposition: A | Discharge status: Home / Self Care | Payer: Self-pay | Attending: Emergency Medicine | Admitting: Emergency Medicine

## 2016-11-03 DIAGNOSIS — M5432 Sciatica, left side: Secondary | ICD-10-CM | POA: Insufficient documentation

## 2016-11-03 DIAGNOSIS — Z7982 Long term (current) use of aspirin: Secondary | ICD-10-CM | POA: Insufficient documentation

## 2016-11-03 DIAGNOSIS — Z765 Malingerer [conscious simulation]: Secondary | ICD-10-CM

## 2016-11-03 DIAGNOSIS — M25552 Pain in left hip: Secondary | ICD-10-CM

## 2016-11-03 DIAGNOSIS — Z87891 Personal history of nicotine dependence: Secondary | ICD-10-CM | POA: Insufficient documentation

## 2016-11-03 MED ORDER — LIDOCAINE 5 % EX PTCH: 1.0000 | MEDICATED_PATCH | CUTANEOUS | 0 refills | Status: AC

## 2016-11-03 MED ORDER — DEXAMETHASONE SODIUM PHOSPHATE 10 MG/ML IJ SOLN
10.0000 mg | Freq: Once | INTRAMUSCULAR | Status: AC
  Administered 2016-11-03: 10 mg via INTRAMUSCULAR
  Filled 2016-11-03: qty 1

## 2016-11-03 NOTE — ED Triage Notes (Signed)
Pt fell yesterday and was evaluated for left hip and back pain; pt has hx of left hip fracture and has hardware in his hip; pt was evaluated yesterday and told that his XRays did not show any damage to his hip, however he states he has a "bone island"; pt is requesting pain medicine so he can get some sleep; ambulatory

## 2016-11-03 NOTE — ED Provider Notes (Signed)
[facility]-EMERGENCY DEPT Provider Note   CSN: [id number] Arrival date & time: 11/03/16  0126  By signing my name below, I, Elder NegusRussell Johnston, attest that this documentation has been prepared under the direction and in the presence of Kethan Papadopoulos, MD. Electronically Signed: Elder Negusussell Johnston, Scribe. 11/03/16. 3:37 AM.   History   Chief Complaint Chief Complaint  Patient presents with  . Hip Pain  . Back Pain    HPI Tyler Garrison is a 51 y.o. male with history of L hip injury requiring surgical intervention who presents to the ED for pain control. This patient presented to this facility 6 hours ago with L hip pain; he received plain films and was subsequently discharged to orthopedic followup. He re-presents to this facility stating "I went home and I couldn't get comfortable in bed. I mostly just want pain medications so I can sleep." He denies any changes in his symptoms following discharge. He is ambulatory.   The history is provided by the patient. No language interpreter was used.  Hip Pain  This is a new problem. The current episode started yesterday. The problem occurs constantly. The problem has not changed since onset.Pertinent negatives include no chest pain, no abdominal pain and no shortness of breath. Exacerbated by: sitting. Relieved by: Better while standing. Treatments tried: hip injection. The treatment provided no relief.    History reviewed. No pertinent past medical history.  There are no active problems to display for this patient.   Past Surgical History:  Procedure Laterality Date  . APPENDECTOMY    . HERNIA REPAIR    . HIP SURGERY         Home Medications    Prior to Admission medications   Medication Sig Start Date End Date Taking? Authorizing Provider  acetaminophen (TYLENOL) 500 MG tablet Take 2 tablets (1,000 mg total) by mouth every 6 (six) hours as needed for mild pain or moderate pain. 11/02/16   Lyndal Pulleyaniel Knott, MD  aspirin 325 MG tablet Take 325  mg by mouth every 4 (four) hours as needed for mild pain or moderate pain.    Historical Provider, MD  ibuprofen (ADVIL,MOTRIN) 800 MG tablet Take 1 tablet (800 mg total) by mouth every 6 (six) hours as needed for moderate pain. 11/02/16   Lyndal Pulleyaniel Knott, MD    Family History Family History  Problem Relation Age of Onset  . Adopted: Yes    Social History Social History  Substance Use Topics  . Smoking status: Former Smoker    Years: 2.00  . Smokeless tobacco: Former NeurosurgeonUser    Types: Snuff    Quit date: 10/13/2010  . Alcohol use Yes     Comment: with dinner     Allergies   Patient has no known allergies.   Review of Systems Review of Systems  Constitutional: Negative for chills and fever.  HENT: Negative for ear pain and sore throat.   Eyes: Negative for pain and visual disturbance.  Respiratory: Negative for cough and shortness of breath.   Cardiovascular: Negative for chest pain and palpitations.  Gastrointestinal: Negative for abdominal pain and vomiting.  Genitourinary: Negative for difficulty urinating, dysuria and hematuria.  Musculoskeletal: Negative for arthralgias and back pain.       Hip pain  Skin: Negative for color change and rash.  Neurological: Negative for seizures, syncope, weakness and numbness.  All other systems reviewed and are negative.    Physical Exam Updated Vital Signs BP 149/98 (BP Location: Right Arm)   Pulse  96   Temp 97.5 F (36.4 C) (Oral)   Resp 20   SpO2 98%   Physical Exam  Constitutional: He appears well-developed and well-nourished.  HENT:  Head: Normocephalic and atraumatic.  Mouth/Throat: Oropharynx is clear and moist. No oropharyngeal exudate.  Eyes: Conjunctivae are normal. Pupils are equal, round, and reactive to light.  Neck: Normal range of motion. Neck supple.  Cardiovascular: Normal rate, regular rhythm, normal heart sounds and intact distal pulses.   No murmur heard. Pulmonary/Chest: Effort normal and breath sounds  normal. No respiratory distress. He has no wheezes. He has no rales.  Abdominal: Soft. Bowel sounds are normal. He exhibits no mass. There is no tenderness. There is no rebound and no guarding.  Musculoskeletal: Normal range of motion. He exhibits no edema, tenderness or deformity.  Gait intact  Neurological: He is alert. He displays normal reflexes.  Skin: Skin is warm and dry.  Psychiatric: He has a normal mood and affect.  Nursing note and vitals reviewed.    ED Treatments / Results   Vitals:   11/03/16 0208 11/03/16 0420  BP: 149/98 136/87  Pulse: 96 84  Resp: 20 18  Temp: 97.5 F (36.4 C) 97.8 F (36.6 C)    DIAGNOSTIC STUDIES: Oxygen Saturation is 98 percent on room air which is normal by my interpretation.    COORDINATION OF CARE: 3:33 AM Discussed treatment plan with pt at bedside and pt agreed to plan.  Labs (all labs ordered are listed, but only abnormal results are displayed) Labs Reviewed - No data to display  EKG  EKG Interpretation None       Radiology Dg Hip Unilat With Pelvis 2-3 Views Left  Result Date: 11/02/2016 CLINICAL DATA:  51 year old male with left hip injury. History of left femoral fixation hardware. EXAM: DG HIP (WITH OR WITHOUT PELVIS) 2-3V LEFT COMPARISON:  Abdominal radiograph dated 05/13/2011 FINDINGS: Left femoral orthopedic hardware including fixation plate and screws of the proximal femoral shaft and transcervical screw. The orthopedic hardware appears intact. 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 Correlation with clinical exam and direct comparison with prior images, if available, recommended. There is no evidence of hardware loosening. No definite acute fracture identified. Mild degenerative changes of the left hip. The soft tissues appear unremarkable. IMPRESSION: 1. 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 Correlation with clinical exam and direct comparison with prior images if available recommended. No definite acute fracture identified. 2. Left femoral orthopedic hardware appears intact and in anatomic alignment. Electronically Signed   By: Elgie Collard M.D.   On: 11/02/2016 21:06    Procedures Procedures (including critical care time)  Medications Ordered in ED  Medications  dexamethasone (DECADRON) injection 10 mg (10 mg Intramuscular Given 11/03/16 0348)     Final Clinical Impressions(s) / ED Diagnoses  Hip pain: this is clearly drug seeking behavior.  He was told earlier he was not getting narcotics.  well appearing, normal vital signs. Based on history and exam patient has been appropriately medically screened and emergency conditions excluded. Patient is stable for discharge at this time. Strict return precautions given for fever, weakness, numbness.Follow up with your PMD in 2 days for recheck.  I personally performed the services described in this documentation, which was scribed in my presence. The recorded information has been reviewed and is accurate.     Branon Sabine,  MD 11/03/16 1610

## 2018-01-08 DIAGNOSIS — J339 Nasal polyp, unspecified: Secondary | ICD-10-CM | POA: Insufficient documentation

## 2018-01-08 DIAGNOSIS — J019 Acute sinusitis, unspecified: Secondary | ICD-10-CM | POA: Insufficient documentation

## 2018-07-10 IMAGING — CR DG HIP (WITH OR WITHOUT PELVIS) 2-3V*L*
3 series · 3 of 3 positions shown · non-contrast
Comparison: Abdominal radiograph dated 05/13/2011

CLINICAL DATA: 50-year-old male with left hip injury. History of
left femoral fixation hardware.

EXAM:
DG HIP (WITH OR WITHOUT PELVIS) 2-3V LEFT

[t pelvis ap]
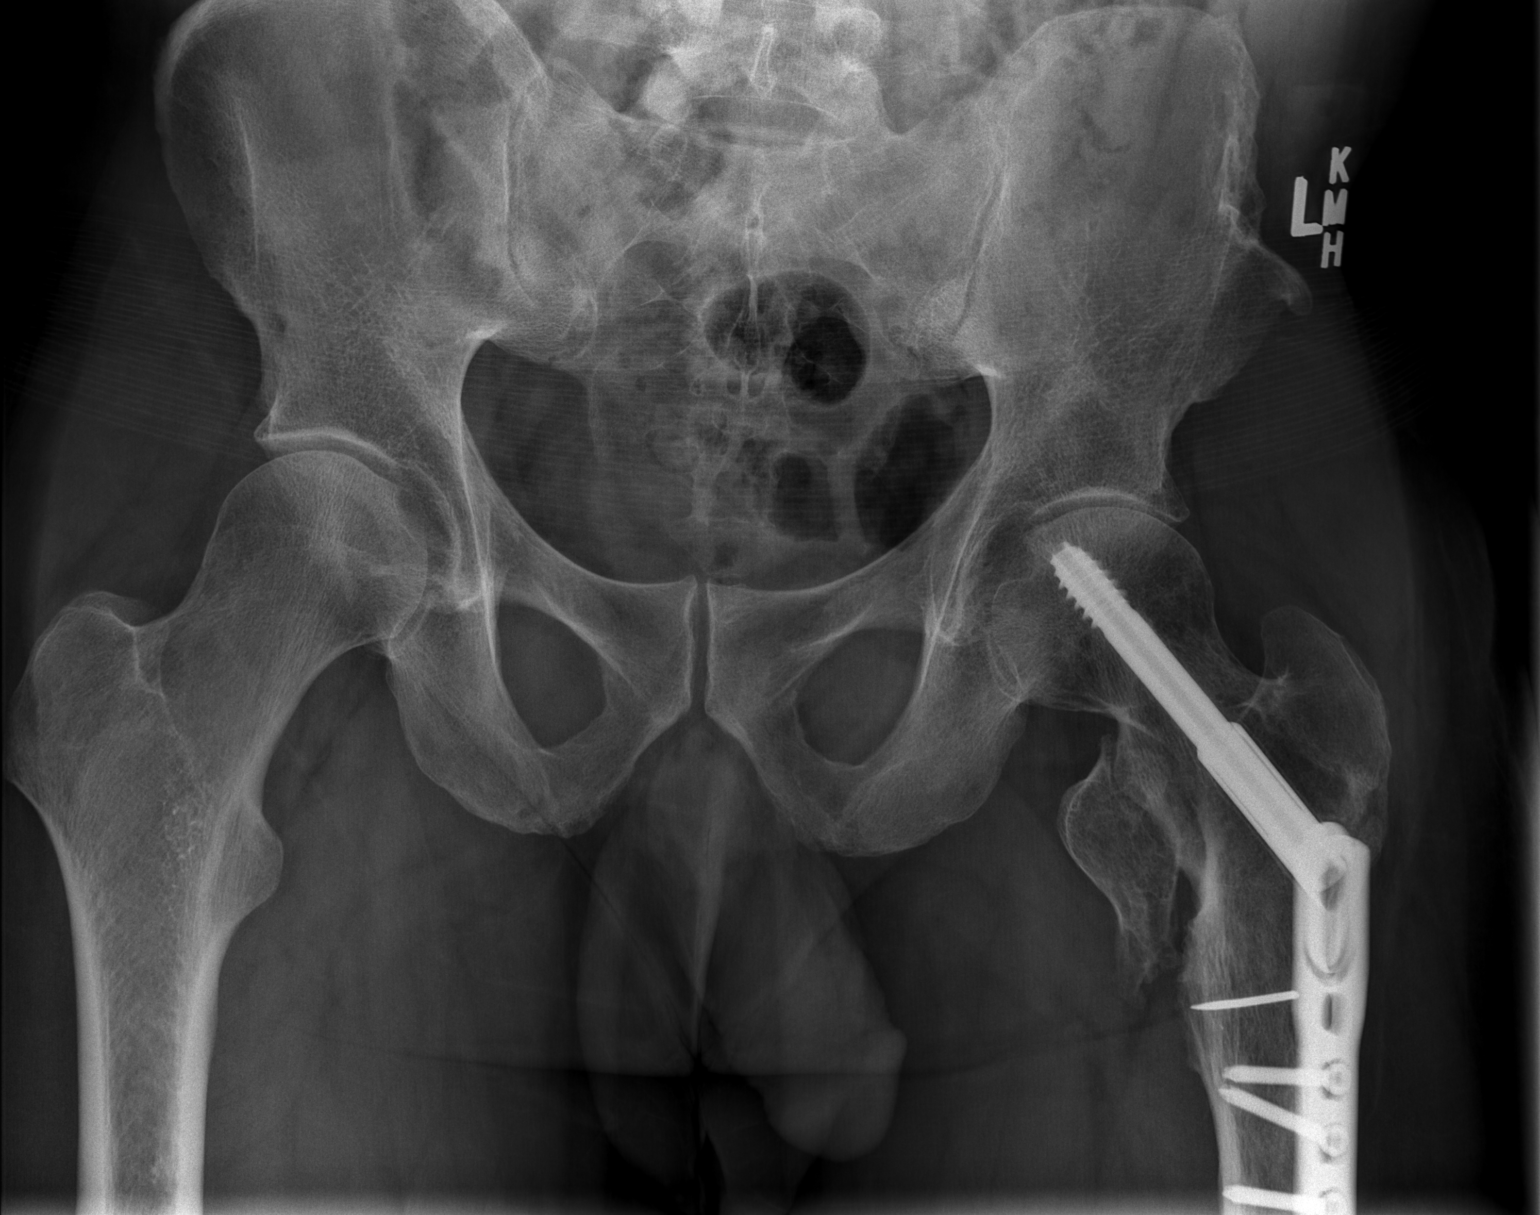

[t hip ap left]
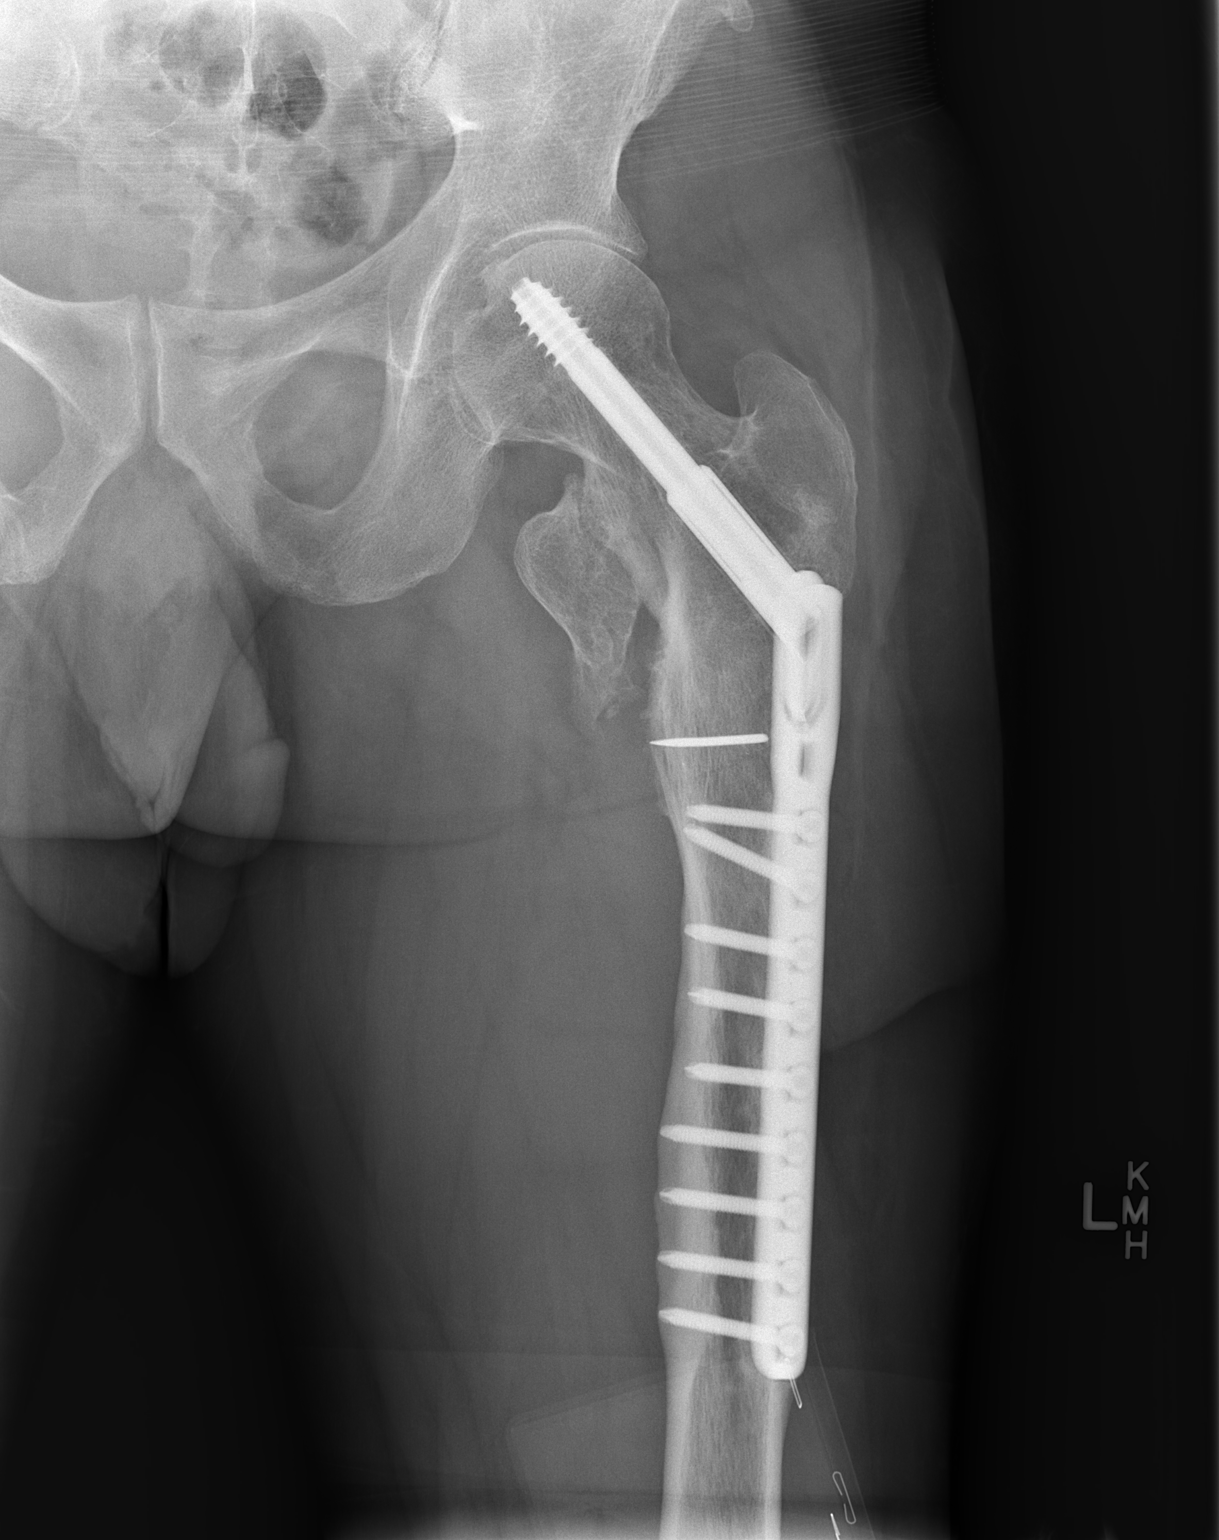

[t hip frog leg left]
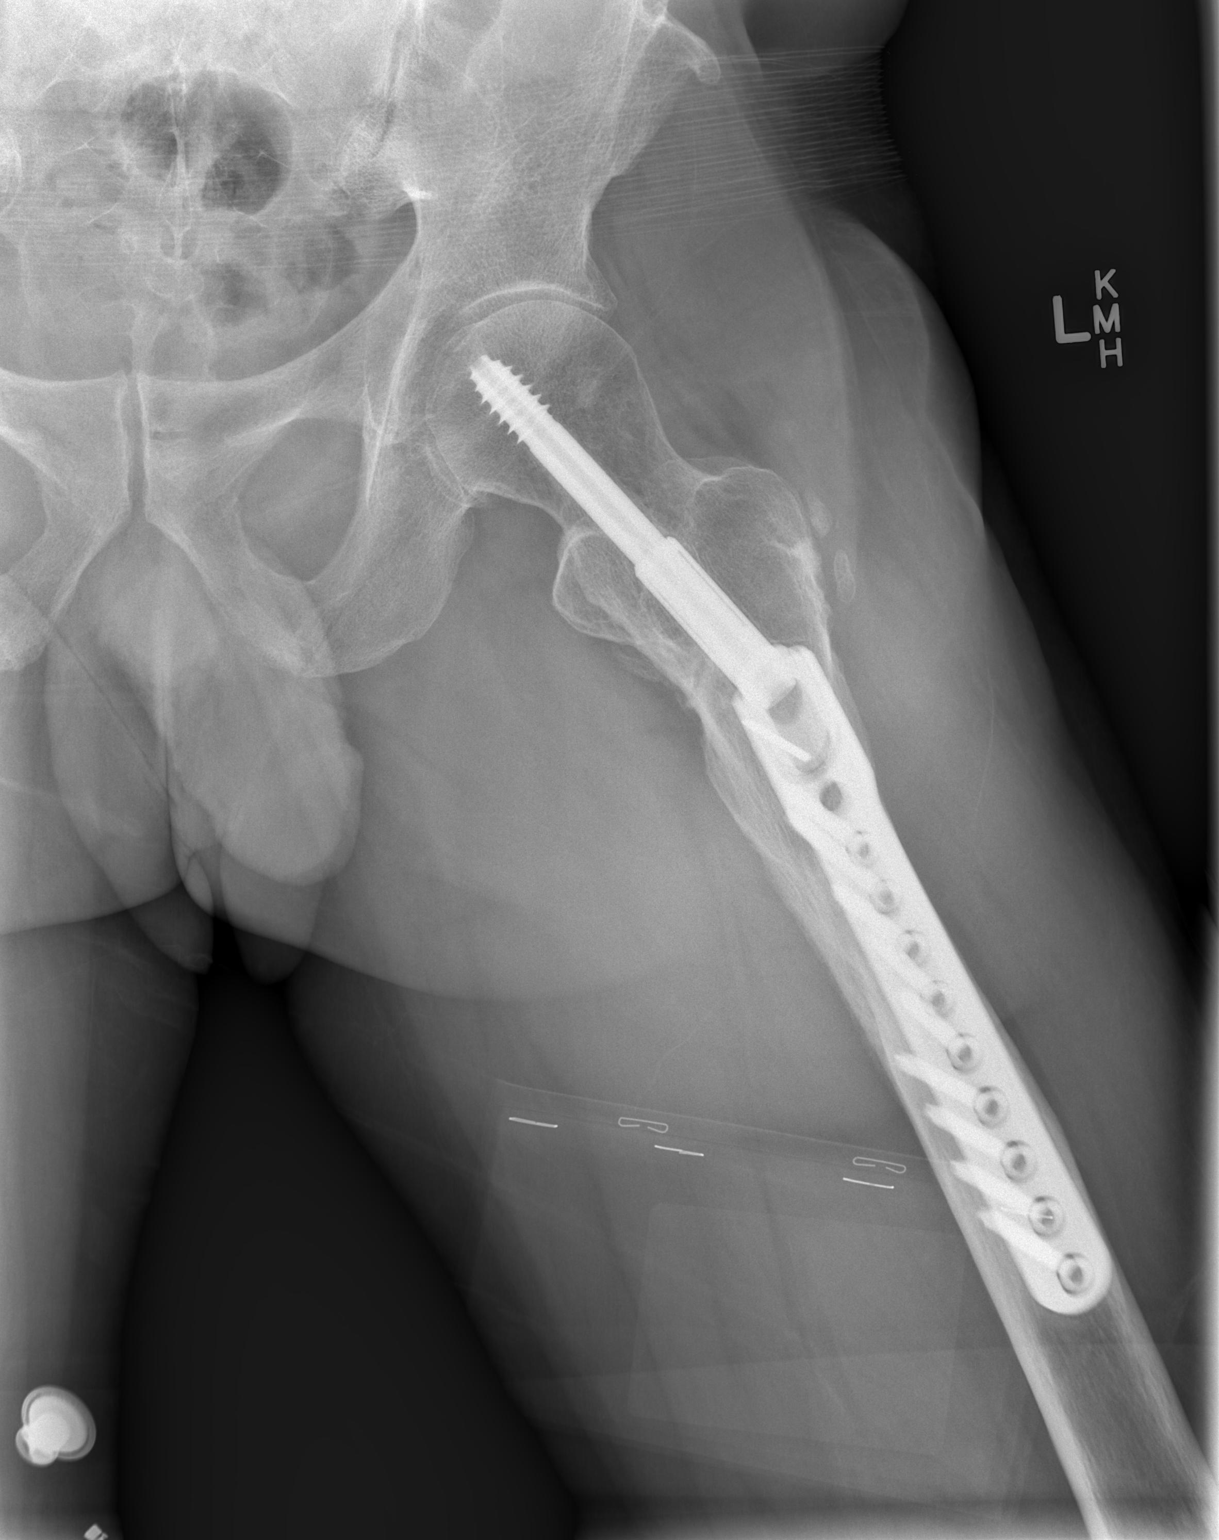

[3 of 3 positions shown; findings below may reference images not displayed]

FINDINGS: Left femoral orthopedic hardware including fixation plate and screws
of the proximal femoral shaft and transcervical screw. The
orthopedic hardware appears intact. There is an age indeterminate
fracture of the lesser trochanter, probably an old fracture with
nonunion. Correlation with clinical exam and direct comparison with
prior images, if available, recommended. There is no evidence of
hardware loosening. No definite acute fracture identified. Mild
degenerative changes of the left hip. The soft tissues appear
unremarkable.
IMPRESSION: 1. Age indeterminate fracture of the left lesser trochanter, likely
an old fracture with nonunion and less likely an acute fracture.
Correlation with clinical exam and direct comparison with prior
images if available recommended. No definite acute fracture
identified.
2. Left femoral orthopedic hardware appears intact and in anatomic
alignment.

## 2020-03-30 ENCOUNTER — Other Ambulatory Visit: Payer: Self-pay

## 2020-03-30 ENCOUNTER — Ambulatory Visit (HOSPITAL_COMMUNITY): Admission: EM | Admit: 2020-03-30 | Discharge: 2020-03-30 | Discharge status: Absent without leave | Payer: Self-pay

## 2020-10-07 ENCOUNTER — Ambulatory Visit (INDEPENDENT_AMBULATORY_CARE_PROVIDER_SITE_OTHER): Payer: Self-pay | Admitting: Otolaryngology

## 2020-10-14 ENCOUNTER — Other Ambulatory Visit: Payer: Self-pay

## 2020-10-14 ENCOUNTER — Ambulatory Visit (INDEPENDENT_AMBULATORY_CARE_PROVIDER_SITE_OTHER): Payer: 59 | Admitting: Otolaryngology

## 2020-10-14 VITALS — Temp 97.2°F

## 2020-10-14 DIAGNOSIS — J339 Nasal polyp, unspecified: Secondary | ICD-10-CM

## 2020-10-14 DIAGNOSIS — J3489 Other specified disorders of nose and nasal sinuses: Secondary | ICD-10-CM

## 2020-10-14 NOTE — Progress Notes (Signed)
HPI: Tyler Garrison is a 55 y.o. male who presents for evaluation of nasal obstruction and sinonasal polyps.  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  He was seen at [facility] and treated with antibiotics as well as saline rinses and Flonase.  More recently has had trouble breathing through the left nostril and presents here for follow-up.  He was told that he had nasal polyps. He has previously been treated with antibiotics and has also previously been treated with Flonase.  He is not sure what other medications he has taken.  No past medical history on file. Past Surgical History:  Procedure Laterality Date  . APPENDECTOMY    . HERNIA REPAIR    . HIP SURGERY     Social History   Socioeconomic History  . Marital status: Married    Spouse name: Not on file  . Number of children: Not on file  . Years of education: Not on file  . Highest education level: Not on file  Occupational History  . Not on file  Tobacco Use  . Smoking status: Former Smoker    Years: 2.00  . Smokeless tobacco: Former Neurosurgeon    Types: Snuff    Quit date: 10/13/2010  Substance and Sexual Activity  . Alcohol use: Yes    Comment: with dinner  . Drug use: No  . Sexual activity: Not on file  Other Topics Concern  . Not on file  Social History Narrative  . Not on file   Social Determinants of Health   Financial Resource Strain: Not on file  Food Insecurity: Not on file  Transportation Needs: Not on file  Physical Activity: Not on file  Stress: Not on file  Social Connections: Not on file   Family History  Adopted: Yes   No Known Allergies Prior to Admission medications   Medication Sig Start Date End Date Taking? Authorizing Provider  acetaminophen (TYLENOL) 500 MG tablet Take 2 tablets (1,000 mg total) by mouth every 6 (six) hours as needed for mild pain or moderate pain. 11/02/16   Lyndal Pulley, MD  aspirin 325 MG tablet Take 325 mg  by mouth every 4 (four) hours as needed for mild pain or moderate pain.    [provider]  ibuprofen (ADVIL,MOTRIN) 800 MG tablet Take 1 tablet (800 mg total) by mouth every 6 (six) hours as needed for moderate pain. 11/02/16   Lyndal Pulley, MD  lidocaine (LIDODERM) 5 % Place 1 patch onto the skin daily. Remove & Discard patch within 12 hours or as directed by MD 11/03/16   Nicanor Alcon, April, MD     Positive ROS: Otherwise negative  All other systems have been reviewed and were otherwise negative with the exception of those mentioned in the HPI and as above.  Physical Exam: Constitutional: Alert, well-appearing, no acute distress Ears: External ears without lesions or tenderness. Ear canals are clear bilaterally with intact, clear TMs.  Nasal: External nose without lesions. Septum midline anteriorly.  He has nasal polyps on both sides the left side worse than the right making visualization of the posterior nasal cavity difficult.. Oral: Lips and gums without lesions. Tongue and palate mucosa without lesions. Posterior oropharynx clear. Neck: No palpable adenopathy or masses Respiratory: Breathing comfortably  Skin: No facial/neck lesions or rash noted.  Procedures  Assessment: Bilateral sinonasal polyps with nasal obstruction  Plan: Placed him on a Sterapred 10 mg 6-day Dosepak in addition to  XHANCE 2 sprays each nostril initially twice daily and then changed to daily.  Discussed with him concerning obtaining a CT scan to evaluate the extent of the sinonasal polyps but he would like to wait to see how he does on the medication first. He will follow-up in 1 month for recheck.  Narda Bonds, MD

## 2020-10-21 ENCOUNTER — Ambulatory Visit: Payer: 59 | Admitting: Podiatry

## 2020-10-26 ENCOUNTER — Ambulatory Visit: Payer: 59 | Admitting: Podiatry

## 2020-10-26 ENCOUNTER — Other Ambulatory Visit: Payer: Self-pay

## 2020-10-26 DIAGNOSIS — M79674 Pain in right toe(s): Secondary | ICD-10-CM

## 2020-10-26 DIAGNOSIS — L989 Disorder of the skin and subcutaneous tissue, unspecified: Secondary | ICD-10-CM

## 2020-11-09 NOTE — Progress Notes (Signed)
   Subjective: 55 y.o. male presenting to the office today as a new patient for evaluation of pain and tenderness to the right third toe.  Pain has been ongoing for approximately 1 month now.  Gradual onset.  Denies a history of injury.  He has not done anything for treatment currently.  He presents for further treatment and evaluation   No past medical history on file.   Objective:  Physical Exam General: Alert and oriented x3 in no acute distress  Dermatology: Hyperkeratotic lesion(s) present on the right third digit lateral aspect. Pain on palpation with a central nucleated core noted. Skin is warm, dry and supple bilateral lower extremities. Negative for open lesions or macerations.  Vascular: Palpable pedal pulses bilaterally. No edema or erythema noted. Capillary refill within normal limits.  Neurological: Epicritic and protective threshold grossly intact bilaterally.   Musculoskeletal Exam: Pain on palpation at the keratotic lesion(s) noted. Range of motion within normal limits bilateral. Muscle strength 5/5 in all groups bilateral.  Assessment: 1.  InterDigital corn third digit right foot lateral aspect 2.  Pain in right third toe   Plan of Care:  1. Patient evaluated 2. Excisional debridement of keratoic lesion(s) using a chisel blade was performed without incident.  3.  Recommend OTC corn callus remover. 4. Patient is to return to the clinic PRN.   Felecia Shelling, DPM [facility]  Dr. Felecia Shelling, DPM    [address] [place], [place] [address]                Office [phone]  Fax [phone]

## 2020-11-18 ENCOUNTER — Ambulatory Visit (INDEPENDENT_AMBULATORY_CARE_PROVIDER_SITE_OTHER): Payer: 59 | Admitting: Otolaryngology

## 2020-12-31 ENCOUNTER — Telehealth (INDEPENDENT_AMBULATORY_CARE_PROVIDER_SITE_OTHER): Payer: Self-pay

## 2020-12-31 NOTE — Telephone Encounter (Signed)
I reached out to pt due to seeing he still had not gotten his [facility]. I had gotten notice from his insurance co. And when I sent in P/A the insurance sent fax back stating they could not find pt. With speaking with pt today, he stated he did not need it and that he felt so good after a couple of weeks after seeing [place] he came in and canceled f/up appt.

## 2022-10-04 ENCOUNTER — Other Ambulatory Visit: Payer: Self-pay | Admitting: Internal Medicine

## 2022-10-04 DIAGNOSIS — M79672 Pain in left foot: Secondary | ICD-10-CM

## 2022-11-29 ENCOUNTER — Other Ambulatory Visit: Payer: Self-pay | Admitting: Otolaryngology

## 2022-11-29 DIAGNOSIS — J329 Chronic sinusitis, unspecified: Secondary | ICD-10-CM

## 2023-12-18 ENCOUNTER — Other Ambulatory Visit: Payer: Self-pay

## 2023-12-18 ENCOUNTER — Encounter: Payer: Self-pay | Admitting: Allergy & Immunology

## 2023-12-18 ENCOUNTER — Ambulatory Visit: Admitting: Allergy & Immunology

## 2023-12-18 VITALS — BP 128/88 | HR 85 | Temp 98.0°F | Resp 16 | Ht 70.0 in | Wt 203.9 lb

## 2023-12-18 DIAGNOSIS — J339 Nasal polyp, unspecified: Secondary | ICD-10-CM | POA: Diagnosis not present

## 2023-12-18 DIAGNOSIS — S40261D Insect bite (nonvenomous) of right shoulder, subsequent encounter: Secondary | ICD-10-CM | POA: Diagnosis not present

## 2023-12-18 NOTE — Patient Instructions (Addendum)
 1. Nasal polyps - I would strongly consider doing Dupixent for treatment of nasal polyps.  - Information provided. Paddy Boas sample provided to use one spray per nostril daily. - From a 2022 paper: five- and 10-year recurrence rates were 30% and 66%, respectively  2. Sneezing and concern of environmental allergies  - We will do environmental allergy testing at the next visit. - Because of insurance stipulations, we cannot do skin testing on the same day as your first visit. - We are all working to fight this, but for now we need to do two separate visits.  - We will know more after we do testing at the next visit.  - The skin testing visit can be squeezed in at your convenience.  - Then we can make a more full plan to address all of your symptoms. - Be sure to stop your antihistamines for 3 days before this appointment.   3. Tick bite - I am labs to look for Lyme and [facility] Spotted Fever as well as alpha gal.  4. Return in about 1 week (around 12/25/2023) for ALLERGY TESTING (1-55). You can have the follow up appointment with Dr. Idolina Maker or a Nurse Practicioner (our Nurse Practitioners are excellent and always have Physician oversight!).    Please inform us  of any Emergency Department visits, hospitalizations, or changes in symptoms. Call us  before going to the ED for breathing or allergy symptoms since we might be able to fit you in for a sick visit. Feel free to contact us  anytime with any questions, problems, or concerns.  It was a pleasure to meet you today!  Websites that have reliable patient information: 1. American Academy of Asthma, Allergy, and Immunology: www.aaaai.org 2. Food Allergy Research and Education (FARE): foodallergy.org 3. Mothers of Asthmatics: http://www.asthmacommunitynetwork.org 4. American College of Allergy, Asthma, and Immunology: www.acaai.org      "Like" us  on Facebook and Instagram for our latest updates!      A healthy democracy works  best when [employer] participate! Make sure you are registered to vote! If you have moved or changed any of your contact information, you will need to get this updated before voting! Scan the QR codes below to learn more!

## 2023-12-18 NOTE — Progress Notes (Signed)
 NEW PATIENT  Date of Service/Encounter:  12/18/23  Consult requested by: Leonarda Rakers, NP   Assessment:   Nasal polyps  Tick bite - checking Lyme and RMSF titers   Environmental allergies - checking environmental allergy panel via the blood  Adopted  Plan/Recommendations:   1. Nasal polyps - I would strongly consider doing Dupixent for treatment of nasal polyps.  - Information provided. Paddy Boas sample provided to use one spray per nostril daily. - From a 2022 paper: five- and 10-year recurrence rates were 30% and 66%, respectively  2. Sneezing and concern of environmental allergies  - We will do environmental allergy testing at the next visit. - Because of insurance stipulations, we cannot do skin testing on the same day as your first visit. - We are all working to fight this, but for now we need to do two separate visits.  - We will know more after we do testing at the next visit.  - The skin testing visit can be squeezed in at your convenience.  - Then we can make a more full plan to address all of your symptoms. - Be sure to stop your antihistamines for 3 days before this appointment.   3. Tick bite - I am labs to look for Lyme and [facility] Spotted Fever as well as alpha gal.  4. Return in about 1 week (around 12/25/2023) for ALLERGY TESTING (1-55). You can have the follow up appointment with Dr. Idolina Maker or a Nurse Practicioner (our Nurse Practitioners are excellent and always have Physician oversight!).   This note in its entirety was forwarded to the Provider who requested this consultation.  Subjective:   Tyler Garrison is a 58 y.o. male presenting today for evaluation of  Chief Complaint  Patient presents with   Establish Care    Nasal Polyps--going on about 2-3 years. Has seen ENT. Nasal congestion, lost of smell.    Tyler Garrison has a history of the following: Patient Active Problem List   Diagnosis Date Noted   Acute non-recurrent  sinusitis 01/08/2018   Nasal polyposis 01/08/2018    History obtained from: chart review and patient.  Discussed the use of AI scribe software for clinical note transcription with the patient and/or guardian, who gave verbal consent to proceed.  Tyler Garrison was referred by Leonarda Rakers, NP.     Tyler Garrison is a 58 y.o. male presenting for an evaluation of nasal polyps .  He has experienced anosmia for several years. Prednisone treatment by multiple ENT specialists temporarily restores his sense of smell, but the effect is not lasting. He has not undergone nasal surgery. Flonase nasal spray does not provide significant relief, and he has not used budesonide or other nasal steroid preparations. Saline rinses have been tried without success. Prednisone has always helped. He has been on Flonase, but no other nasal sprays. Dr. Corbett Desanctis note recommended trying Paddy Boas, but he does not recognize the device at all.   He experiences nasal congestion but can breathe through his sinuses about 90-92% of the time. No frequent sinus infections, though he was once prescribed antibiotics. He has not been tested for environmental allergies and does not report significant sneezing or itchy eyes unless on prednisone, which alleviates these symptoms.  He has a history of a tick bite on his right shoulder about three to four weeks ago, resulting in a small, persistent swelling. No fever or significant symptoms followed the bite. He continues to consume red meat without  issues, suggesting no development of alpha-gal syndrome.  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 He has a history of smoking for a couple of years in his late teens and used [place] on and off for a couple of decades, which he notes affected his physical strength and likelihood of injury. He has since quit these habits.  His father had nasal polyps and underwent surgery, but he is not biologically related as he is adopted.      Otherwise, there is no history of other atopic diseases, including asthma, food allergies, drug allergies, stinging insect allergies, or contact dermatitis. There is no significant infectious history. Vaccinations are up to date.    Past Medical History: Patient Active Problem List   Diagnosis Date Noted   Acute non-recurrent sinusitis 01/08/2018   Nasal polyposis 01/08/2018    Medication List:  Allergies as of 12/18/2023   No Known Allergies      Medication List        Accurate as of December 18, 2023 12:47 PM. If you have any questions, ask your nurse or doctor.          acetaminophen  500 MG tablet Commonly known as: TYLENOL  Take 2 tablets (1,000 mg total) by mouth every 6 (six) hours as needed for mild pain or moderate pain.   aspirin 325 MG tablet Take 325 mg by mouth every 4 (four) hours as needed for mild pain or moderate pain.   ibuprofen  800 MG tablet Commonly known as: ADVIL  Take 1 tablet (800 mg total) by mouth every 6 (six) hours as needed for moderate pain.   lidocaine  5 % Commonly known as: Lidoderm  Place 1 patch onto the skin daily. Remove & Discard patch within 12 hours or as directed by MD        Birth History: non-contributory  Developmental History: non-contributory  Past Surgical History: Past Surgical History:  Procedure Laterality Date   APPENDECTOMY     HERNIA REPAIR     HIP SURGERY       Family History: Family History  Adopted: Yes     Social History: Tyler Garrison lives at home with his family. He lives in a house with wood throughout the home. There is gas heating and central cooling. There are dogs inside of the home and birds outside of the home. There are dust mite coverings on the bed, but not the pillows. There are no roaches in the home. There is no HEPA filter in the home. He currently works as a Games developer. They do not live near an interstate or industrial area.    Review of systems otherwise negative other than  that mentioned in the HPI.    Objective:   Blood pressure 128/88, pulse 85, temperature 98 F (36.7 C), temperature source Temporal, resp. rate 16, height 5\' 10"  (1.778 m), weight 203 lb 14.4 oz (92.5 kg), SpO2 96%. Body mass index is 29.26 kg/m.     Physical Exam Constitutional:      Appearance: He is well-developed.  HENT:     Head: Normocephalic and atraumatic.     Right Ear: Tympanic membrane, ear canal and external ear normal. No drainage, swelling or tenderness. Tympanic membrane is not injected, scarred, erythematous, retracted or bulging.     Left Ear: Tympanic membrane, ear canal and external ear normal. No drainage, swelling or tenderness. Tympanic membrane is not injected, scarred, erythematous, retracted or bulging.     Nose: No nasal deformity, septal deviation, mucosal edema or rhinorrhea.  Right Turbinates: Enlarged, swollen and pale.     Left Turbinates: Enlarged, swollen and pale.     Right Sinus: No maxillary sinus tenderness or frontal sinus tenderness.     Left Sinus: No maxillary sinus tenderness or frontal sinus tenderness.     Comments: Nasal polpys bilaterally, most prominent in the right nares. These are obstructing 100% of the nasal cavity bilaterally.     Mouth/Throat:     Mouth: Mucous membranes are not pale and not dry.     Pharynx: Uvula midline.  Eyes:     General:        Right eye: No discharge.        Left eye: No discharge.     Conjunctiva/sclera: Conjunctivae normal.     Right eye: Right conjunctiva is not injected. No chemosis.    Left eye: Left conjunctiva is not injected. No chemosis.    Pupils: Pupils are equal, round, and reactive to light.  Cardiovascular:     Rate and Rhythm: Normal rate and regular rhythm.     Heart sounds: Normal heart sounds.  Pulmonary:     Effort: Pulmonary effort is normal. No tachypnea, accessory muscle usage or respiratory distress.     Breath sounds: Normal breath sounds. No wheezing, rhonchi or rales.   Chest:     Chest wall: No tenderness.  Abdominal:     Tenderness: There is no abdominal tenderness. There is no guarding or rebound.  Lymphadenopathy:     Head:     Right side of head: No submandibular, tonsillar or occipital adenopathy.     Left side of head: No submandibular, tonsillar or occipital adenopathy.     Cervical: No cervical adenopathy.  Skin:    Coloration: Skin is not pale.     Findings: No abrasion, erythema, petechiae or rash. Rash is not papular, urticarial or vesicular.  Neurological:     Mental Status: He is alert.      Diagnostic studies: deferred due to insurance stipulations that require a separate visit for testing        Drexel Gentles, MD [facility] 

## 2023-12-20 LAB — LYME DISEASE SEROLOGY W/REFLEX: Lyme Total Antibody EIA: NEGATIVE

## 2023-12-21 LAB — ALPHA-GAL PANEL: IgE (Immunoglobulin E), Serum: 72 [IU]/mL (ref 6–495)

## 2023-12-21 LAB — SPOTTED FEVER GROUP ANTIBODIES
Spotted Fever Group IgG: <1:64 {titer}
Spotted Fever Group IgM: <1:64 {titer}

## 2023-12-25 ENCOUNTER — Encounter: Payer: Self-pay | Admitting: Allergy & Immunology

## 2023-12-25 ENCOUNTER — Ambulatory Visit: Admitting: Allergy & Immunology

## 2023-12-25 DIAGNOSIS — J338 Other polyp of sinus: Secondary | ICD-10-CM

## 2023-12-25 DIAGNOSIS — J339 Nasal polyp, unspecified: Secondary | ICD-10-CM

## 2023-12-25 DIAGNOSIS — J310 Chronic rhinitis: Secondary | ICD-10-CM | POA: Diagnosis not present

## 2023-12-25 NOTE — Patient Instructions (Addendum)
 1. Nasal polyps - I would strongly consider doing Dupixent for treatment of nasal polyps.  - Information provided at the last visit. - Consent signed and Tammy will get a hold of you to discuss the insurance approval process.  Paddy Boas sample provided at the last visit.   2. Sneezing and concern of environmental allergies  - Testing today showed: NEGATIVE TO THE ENTIRE PANEL - Copy of test results provided.  - Avoidance measures provided. - Continue with: the current plan - Dupixent consent signed.   3. Tick bite - Testing was negative thankfully.   4. Return in about 6 months (around 06/26/2024). You can have the follow up appointment with Dr. Idolina Maker or a Nurse Practicioner (our Nurse Practitioners are excellent and always have Physician oversight!).    Please inform us  of any Emergency Department visits, hospitalizations, or changes in symptoms. Call us  before going to the ED for breathing or allergy symptoms since we might be able to fit you in for a sick visit. Feel free to contact us  anytime with any questions, problems, or concerns.  It was a pleasure to see you again today!  Websites that have reliable patient information: 1. American Academy of Asthma, Allergy, and Immunology: www.aaaai.org 2. Food Allergy Research and Education (FARE): foodallergy.org 3. Mothers of Asthmatics: http://www.asthmacommunitynetwork.org 4. American College of Allergy, Asthma, and Immunology: www.acaai.org      "Like" us  on Facebook and Instagram for our latest updates!      A healthy democracy works best when [employer] participate! Make sure you are registered to vote! If you have moved or changed any of your contact information, you will need to get this updated before voting! Scan the QR codes below to learn more!       Airborne Adult Perc - 12/25/23 1122     Time Antigen Placed 1100    Allergen Manufacturer Floyd Hutchinson    Location Back    Number of Test 55    1. Control-Buffer 50%  Glycerol Negative    2. Control-Histamine 3+    3. Bahia Negative    4. French Southern Territories Negative    5. Johnson Negative    6. [place]  Blue Negative    7. Meadow Fescue Negative    8. Perennial Rye Negative    9. Timothy Negative    10. Ragweed Mix Negative    11. Cocklebur Negative    12. Plantain,  English Negative    13. Baccharis Negative    14. Dog Fennel Negative    15. Russian Thistle Negative    16. Lamb's Quarters Negative    17. Sheep Sorrell Negative    18. Rough Pigweed Negative    19. Marsh Elder, Rough Negative    20. Mugwort, Common Negative    21. Box, Elder Negative    22. Cedar, red Negative    23. Sweet Gum Negative    24. Pecan Pollen Negative    25. Pine Mix Negative    26. Walnut, Black Pollen Negative    27. Red Mulberry Negative    28. Ash Mix Negative    29. Birch Mix Negative    30. Beech American Negative    31. Cottonwood, Guinea-Bissau Negative    32. Hickory, White Negative    33. Maple Mix Negative    34. Oak, Guinea-[place]iss[place] Mix Negative    35. Sycamore Eastern Negative    36. Alternaria Alternata Negative    37. Cladosporium Herbarum Negative    38. Aspergillus Mix  Negative    39. Penicillium Mix Negative    40. Bipolaris Sorokiniana (Helminthosporium) Negative    41. Drechslera Spicifera (Curvularia) Negative    42. Mucor Plumbeus Negative    43. Fusarium Moniliforme Negative    44. Aureobasidium Pullulans (pullulara) Negative    45. Rhizopus Oryzae Negative    46. Botrytis Cinera Negative    47. Epicoccum Nigrum Negative    48. Phoma Betae Negative    49. Dust Mite Mix Negative    50. Cat Hair 10,000 BAU/ml Negative    51.  Dog Epithelia Negative    52. Mixed Feathers Negative    53. Horse Epithelia Negative    54. Cockroach, German Negative    55. Tobacco Leaf Negative

## 2023-12-25 NOTE — Progress Notes (Signed)
 FOLLOW UP  Date of Service/Encounter:  12/25/23   Assessment:   Nasal polyps - interested in starting Dupixent   Tick bite - checking Lyme and RMSF titers    Nonallergic rhinitis  Adopted  Plan/Recommendations:   1. Nasal polyps - I would strongly consider doing Dupixent for treatment of nasal polyps.  - Information provided at the last visit. - Consent signed and Tammy will get a hold of you to discuss the insurance approval process.  Paddy Boas sample provided at the last visit.   2. Sneezing and concern of environmental allergies  - Testing today showed: NEGATIVE TO THE ENTIRE PANEL - Copy of test results provided.  - Avoidance measures provided. - Continue with: the current plan - Dupixent consent signed.   3. Tick bite - Testing was negative thankfully.   4. Return in about 6 months (around 06/26/2024). You can have the follow up appointment with Dr. Idolina Maker or a Nurse Practicioner (our Nurse Practitioners are excellent and always have Physician oversight!).   Subjective:   Tyler Garrison is a 58 y.o. male presenting today for follow up of No chief complaint on file.   Tyler Garrison has a history of the following: Patient Active Problem List   Diagnosis Date Noted   Acute non-recurrent sinusitis 01/08/2018   Nasal polyposis 01/08/2018    History obtained from: chart review and patient.  Discussed the use of AI scribe software for clinical note transcription with the patient and/or guardian, who gave verbal consent to proceed.  Tyler Garrison is a 58 y.o. male presenting for skin testing. He was last seen on April 28th. We could not do testing because his insurance company does not cover testing on the same day as a New Patient visit. He has been off of all antihistamines 3 days in anticipation of the testing.   At that time, he had nasal polyps and we discussed doing Dupixent for long term control. We gave him a sample of Xhance. We decided to do environmental  allergy testing for long term control. He had some recent tick bites, therefore we did blood work to look for tickborne illnesses. This was negative thankfully.   Otherwise, there have been no changes to his past medical history, surgical history, family history, or social history.    Review of systems otherwise negative other than that mentioned in the HPI.    Objective:   There were no vitals taken for this visit. There is no height or weight on file to calculate BMI.    Physical exam deferred since this was a skin testing appointment only.   Diagnostic studies:    Allergy Studies:     Airborne Adult Perc - 12/25/23 1122     Time Antigen Placed 1100    Allergen Manufacturer Floyd Hutchinson    Location Back    Number of Test 55    1. Control-Buffer 50% Glycerol Negative    2. Control-Histamine 3+    3. Bahia Negative    4. French Southern Territories Negative    5. Johnson Negative    6. [place]  Blue Negative    7. Meadow Fescue Negative    8. Perennial Rye Negative    9. Timothy Negative    10. Ragweed Mix Negative    11. Cocklebur Negative    12. Plantain,  English Negative    13. Baccharis Negative    14. Dog Fennel Negative    15. Russian Thistle Negative    16. Lamb's Quarters Negative  17. Sheep Sorrell Negative    18. Rough Pigweed Negative    19. Marsh Elder, Rough Negative    20. Mugwort, Common Negative    21. Box, Elder Negative    22. Cedar, red Negative    23. Sweet Gum Negative    24. Pecan Pollen Negative    25. Pine Mix Negative    26. Walnut, Black Pollen Negative    27. Red Mulberry Negative    28. Ash Mix Negative    29. Birch Mix Negative    30. Beech American Negative    31. Cottonwood, Guinea-Bissau Negative    32. Hickory, White Negative    33. Maple Mix Negative    34. Oak, [place] Mix Negative    35. Sycamore Eastern Negative    36. Alternaria Alternata Negative    37. Cladosporium Herbarum Negative    38. Aspergillus Mix Negative    39. Penicillium Mix  Negative    40. Bipolaris Sorokiniana (Helminthosporium) Negative    41. Drechslera Spicifera (Curvularia) Negative    42. Mucor Plumbeus Negative    43. Fusarium Moniliforme Negative    44. Aureobasidium Pullulans (pullulara) Negative    45. Rhizopus Oryzae Negative    46. Botrytis Cinera Negative    47. Epicoccum Nigrum Negative    48. Phoma Betae Negative    49. Dust Mite Mix Negative    50. Cat Hair 10,000 BAU/ml Negative    51.  Dog Epithelia Negative    52. Mixed Feathers Negative    53. Horse Epithelia Negative    54. Cockroach, German Negative    55. Tobacco Leaf Negative                  Drexel Gentles, MD  [facility] 

## 2024-01-02 ENCOUNTER — Telehealth: Payer: Self-pay | Admitting: *Deleted

## 2024-01-02 NOTE — Telephone Encounter (Signed)
-----   Message from [place] Chuck sent at 12/25/2023 12:10 PM EDT ----- Dupixent for nasal polyps.  Consent signed.

## 2024-01-02 NOTE — Telephone Encounter (Signed)
 T/c to patient his Ins excludes coverage of specialty medications so we will need to try and get him patient assistance for dupixent will mail app to him

## 2024-01-04 NOTE — Telephone Encounter (Signed)
Thanks, Tammy!   Akeel Reffner, MD [facility]  

## 2024-02-02 ENCOUNTER — Ambulatory Visit

## 2024-02-13 ENCOUNTER — Ambulatory Visit (INDEPENDENT_AMBULATORY_CARE_PROVIDER_SITE_OTHER)

## 2024-02-13 DIAGNOSIS — J339 Nasal polyp, unspecified: Secondary | ICD-10-CM | POA: Diagnosis not present

## 2024-02-13 MED ORDER — DUPILUMAB 300 MG/2ML ~~LOC~~ SOSY
300.0000 mg | PREFILLED_SYRINGE | SUBCUTANEOUS | Status: AC
  Administered 2024-02-13 – 2024-09-25 (×16): 300 mg via SUBCUTANEOUS

## 2024-02-13 NOTE — Progress Notes (Signed)
 Immunotherapy   Patient Details  Name: Tyler Garrison MRN: [id number] Date of Birth: 1966-04-21  02/13/2024  Tyler Garrison started injections for nasal polyps. Patient received 300 mg Dupixent and waited 15 minute with no problems.  Frequency: every 14 days Consent signed and patient instructions given.   Tyler Garrison 02/13/2024, 11:12 AM

## 2024-02-27 ENCOUNTER — Ambulatory Visit

## 2024-02-27 DIAGNOSIS — J339 Nasal polyp, unspecified: Secondary | ICD-10-CM | POA: Diagnosis not present

## 2024-03-12 ENCOUNTER — Ambulatory Visit

## 2024-03-13 ENCOUNTER — Ambulatory Visit

## 2024-03-13 DIAGNOSIS — J339 Nasal polyp, unspecified: Secondary | ICD-10-CM

## 2024-03-29 ENCOUNTER — Ambulatory Visit

## 2024-03-29 DIAGNOSIS — J339 Nasal polyp, unspecified: Secondary | ICD-10-CM

## 2024-04-17 ENCOUNTER — Ambulatory Visit (INDEPENDENT_AMBULATORY_CARE_PROVIDER_SITE_OTHER)

## 2024-04-17 DIAGNOSIS — J339 Nasal polyp, unspecified: Secondary | ICD-10-CM

## 2024-04-17 DIAGNOSIS — J310 Chronic rhinitis: Secondary | ICD-10-CM

## 2024-04-17 DIAGNOSIS — L209 Atopic dermatitis, unspecified: Secondary | ICD-10-CM | POA: Diagnosis not present

## 2024-04-26 ENCOUNTER — Ambulatory Visit

## 2024-05-01 ENCOUNTER — Ambulatory Visit (INDEPENDENT_AMBULATORY_CARE_PROVIDER_SITE_OTHER)

## 2024-05-01 DIAGNOSIS — J339 Nasal polyp, unspecified: Secondary | ICD-10-CM

## 2024-05-15 ENCOUNTER — Ambulatory Visit

## 2024-05-20 ENCOUNTER — Ambulatory Visit (INDEPENDENT_AMBULATORY_CARE_PROVIDER_SITE_OTHER)

## 2024-05-20 DIAGNOSIS — J339 Nasal polyp, unspecified: Secondary | ICD-10-CM

## 2024-06-03 ENCOUNTER — Ambulatory Visit

## 2024-06-03 DIAGNOSIS — J339 Nasal polyp, unspecified: Secondary | ICD-10-CM

## 2024-06-17 ENCOUNTER — Ambulatory Visit (INDEPENDENT_AMBULATORY_CARE_PROVIDER_SITE_OTHER)

## 2024-06-17 DIAGNOSIS — J339 Nasal polyp, unspecified: Secondary | ICD-10-CM | POA: Diagnosis not present

## 2024-07-01 ENCOUNTER — Ambulatory Visit

## 2024-07-02 ENCOUNTER — Ambulatory Visit

## 2024-07-02 DIAGNOSIS — J339 Nasal polyp, unspecified: Secondary | ICD-10-CM

## 2024-07-16 ENCOUNTER — Ambulatory Visit

## 2024-07-17 ENCOUNTER — Ambulatory Visit (INDEPENDENT_AMBULATORY_CARE_PROVIDER_SITE_OTHER)

## 2024-07-17 ENCOUNTER — Ambulatory Visit

## 2024-07-17 DIAGNOSIS — J339 Nasal polyp, unspecified: Secondary | ICD-10-CM

## 2024-07-31 ENCOUNTER — Ambulatory Visit (INDEPENDENT_AMBULATORY_CARE_PROVIDER_SITE_OTHER)

## 2024-07-31 ENCOUNTER — Ambulatory Visit

## 2024-07-31 DIAGNOSIS — J339 Nasal polyp, unspecified: Secondary | ICD-10-CM | POA: Diagnosis not present

## 2024-08-13 ENCOUNTER — Ambulatory Visit (INDEPENDENT_AMBULATORY_CARE_PROVIDER_SITE_OTHER)

## 2024-08-13 DIAGNOSIS — J339 Nasal polyp, unspecified: Secondary | ICD-10-CM

## 2024-08-28 ENCOUNTER — Ambulatory Visit (INDEPENDENT_AMBULATORY_CARE_PROVIDER_SITE_OTHER)

## 2024-08-28 DIAGNOSIS — J339 Nasal polyp, unspecified: Secondary | ICD-10-CM | POA: Diagnosis not present

## 2024-09-11 ENCOUNTER — Ambulatory Visit

## 2024-09-12 ENCOUNTER — Ambulatory Visit

## 2024-09-12 DIAGNOSIS — J339 Nasal polyp, unspecified: Secondary | ICD-10-CM

## 2024-09-25 ENCOUNTER — Ambulatory Visit

## 2024-09-25 DIAGNOSIS — J339 Nasal polyp, unspecified: Secondary | ICD-10-CM

## 2024-10-09 ENCOUNTER — Ambulatory Visit
